# Patient Record
Sex: Female | Born: 1995 | Race: White | Hispanic: No | Marital: Married | Smoking: Never smoker
Health system: Southern US, Community
[De-identification: ages and names within clinical notes are randomized; demographics above are authoritative.]

## PROBLEM LIST (undated history)

## (undated) DIAGNOSIS — L709 Acne, unspecified: Secondary | ICD-10-CM

## (undated) HISTORY — DX: Acne, unspecified: L70.9

---
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
## 2015-01-23 ENCOUNTER — Telehealth: Payer: Self-pay | Admitting: Unknown Physician Specialty

## 2015-01-23 NOTE — Telephone Encounter (Signed)
Pt's father called, pt is away at college and needs a copy of her immunization records. Please call once report is ready for pick up.

## 2015-01-24 NOTE — Telephone Encounter (Signed)
Amy and I both looked in the system for the patient's shot records and could not find them. The patient's father came in and we told him that we could not find him ad asked for him to check with the health department or the patient's high school.

## 2017-01-25 ENCOUNTER — Ambulatory Visit (INDEPENDENT_AMBULATORY_CARE_PROVIDER_SITE_OTHER): Payer: BLUE CROSS/BLUE SHIELD | Admitting: Family Medicine

## 2017-01-25 ENCOUNTER — Encounter: Payer: Self-pay | Admitting: Family Medicine

## 2017-01-25 VITALS — BP 110/85 | HR 86 | Temp 99.1°F | Wt 139.0 lb

## 2017-01-25 DIAGNOSIS — Z30011 Encounter for initial prescription of contraceptive pills: Secondary | ICD-10-CM | POA: Diagnosis not present

## 2017-01-25 LAB — PREGNANCY, URINE: Preg Test, Ur: NEGATIVE

## 2017-01-25 MED ORDER — NORGESTIMATE-ETH ESTRADIOL 0.25-35 MG-MCG PO TABS: 1.0000 | ORAL_TABLET | Freq: Every day | ORAL | 11 refills | Status: DC

## 2017-01-25 NOTE — Patient Instructions (Addendum)
Follow up in about 3 months for fasting labs, complete physical exam, and pap smear Oral Contraception Information Oral contraceptive pills (OCPs) are medicines taken to prevent pregnancy. OCPs work by preventing the ovaries from releasing eggs. The hormones in OCPs also cause the cervical mucus to thicken, preventing the sperm from entering the uterus. The hormones also cause the uterine lining to become thin, not allowing a fertilized egg to attach to the inside of the uterus. OCPs are highly effective when taken exactly as prescribed. However, OCPs do not prevent sexually transmitted diseases (STDs). Safe sex practices, such as using condoms along with the pill, can help prevent STDs. Before taking the pill, you may have a physical exam and Pap test. Your health care provider may order blood tests. The health care provider will make sure you are a good candidate for oral contraception. Discuss with your health care provider the possible side effects of the OCP you may be prescribed. When starting an OCP, it can take 2 to 3 months for the body to adjust to the changes in hormone levels in your body. Types of oral contraception  The combination pill-This pill contains estrogen and progestin (synthetic progesterone) hormones. The combination pill comes in 21-day, 28-day, or 91-day packs. Some types of combination pills are meant to be taken continuously (365-day pills). With 21-day packs, you do not take pills for 7 days after the last pill. With 28-day packs, the pill is taken every day. The last 7 pills are without hormones. Certain types of pills have more than 21 hormone-containing pills. With 91-day packs, the first 84 pills contain both hormones, and the last 7 pills contain no hormones or contain estrogen only.  The minipill-This pill contains the progesterone hormone only. The pill is taken every day continuously. It is very important to take the pill at the same time each day. The minipill comes in  packs of 28 pills. All 28 pills contain the hormone. Advantages of oral contraceptive pills  Decreases premenstrual symptoms.  Treats menstrual period cramps.  Regulates the menstrual cycle.  Decreases a heavy menstrual flow.  May treatacne, depending on the type of pill.  Treats abnormal uterine bleeding.  Treats polycystic ovarian syndrome.  Treats endometriosis.  Can be used as emergency contraception. Things that can make oral contraceptive pills less effective OCPs can be less effective if:  You forget to take the pill at the same time every day.  You have a stomach or intestinal disease that lessens the absorption of the pill.  You take OCPs with other medicines that make OCPs less effective, such as antibiotics, certain HIV medicines, and some seizure medicines.  You take expired OCPs.  You forget to restart the pill on day 7, when using the packs of 21 pills.  Risks associated with oral contraceptive pills Oral contraceptive pills can sometimes cause side effects, such as:  Headache.  Nausea.  Breast tenderness.  Irregular bleeding or spotting.  Combination pills are also associated with a small increased risk of:  Blood clots.  Heart attack.  Stroke.  This information is not intended to replace advice given to you by your health care provider. Make sure you discuss any questions you have with your health care provider. Document Released: 08/07/2002 Document Revised: 10/23/2015 Document Reviewed: 11/05/2012 Elsevier Interactive Patient Education  [employer].

## 2017-01-25 NOTE — Progress Notes (Signed)
   BP 110/85   Pulse 86   Temp 99.1 F (37.3 C)   Wt 139 lb (63 kg)   LMP 01/22/2017 (Exact Date)   SpO2 100%    Subjective:    Patient ID: Kiara Franklin, female    DOB: 05/02/1996, 21 y.o.   MRN: [id number]  HPI: Kiara Franklin is a 21 y.o. female  Chief Complaint  Patient presents with  . Contraception    wants to get started on BCPs.   Patient presents today for initial prescription of contraceptives. Getting married soon and wanting to start something. Has never been on anything in the past. Periods have historically been mild and regular. No hx of migraines, blood clots, or fhx of breast cancer. Denies any chance of pregnancy.   Relevant past medical, surgical, family and social history reviewed and updated as indicated. Interim medical history since our last visit reviewed. Allergies and medications reviewed and updated.  Review of Systems  Constitutional: Negative.   HENT: Negative.   Respiratory: Negative.   Cardiovascular: Negative.   Gastrointestinal: Negative.   Genitourinary: Negative.   Musculoskeletal: Negative.   Neurological: Negative.   Psychiatric/Behavioral: Negative.    Per HPI unless specifically indicated above     Objective:    BP 110/85   Pulse 86   Temp 99.1 F (37.3 C)   Wt 139 lb (63 kg)   LMP 01/22/2017 (Exact Date)   SpO2 100%   Wt Readings from Last 3 Encounters:  01/25/17 139 lb (63 kg)    Physical Exam  Constitutional: She is oriented to person, place, and time. She appears well-developed and well-nourished. No distress.  HENT:  Head: Atraumatic.  Eyes: Pupils are equal, round, and reactive to light. Conjunctivae are normal.  Neck: Normal range of motion. Neck supple.  Cardiovascular: Normal rate and normal heart sounds.   Pulmonary/Chest: Effort normal and breath sounds normal. No respiratory distress.  Musculoskeletal: Normal range of motion.  Neurological: She is alert and oriented to person, place, and time.  Skin: Skin is  warm and dry.  Psychiatric: She has a normal mood and affect. Her behavior is normal.  Nursing note and vitals reviewed.  No results found for this or any previous visit.    Assessment & Plan:   Problem List Items Addressed This Visit    None    Visit Diagnoses    Encounter for oral contraception initial prescription    -  Primary   Await urine pregnancy results. Ortho cyclen sent, after long discussion of many options of birth controls. Risks and benefits reviewed. F/u for CPE and pap   Relevant Orders   Pregnancy, urine       Follow up plan: Return in about 3 months (around 04/27/2017) for CPE and pap.

## 2017-02-15 ENCOUNTER — Encounter: Payer: Self-pay | Admitting: Family Medicine

## 2017-03-17 ENCOUNTER — Other Ambulatory Visit: Payer: Self-pay | Admitting: Family Medicine

## 2017-03-17 ENCOUNTER — Encounter: Payer: Self-pay | Admitting: Family Medicine

## 2017-03-17 MED ORDER — NORGESTIMATE-ETH ESTRADIOL 0.25-35 MG-MCG PO TABS: 1.0000 | ORAL_TABLET | Freq: Every day | ORAL | 2 refills | Status: DC

## 2017-03-18 ENCOUNTER — Encounter: Payer: Self-pay | Admitting: Family Medicine

## 2017-03-18 NOTE — Telephone Encounter (Signed)
I spoke with patient, she is getting married next Sat. She is scheduled to start her period right before her wedding. Wants to know if she can go ahead and start her next pack of BCP to hopefully prevent her cycle?

## 2017-04-08 ENCOUNTER — Other Ambulatory Visit: Payer: Self-pay

## 2017-04-08 MED ORDER — NORGESTIMATE-ETH ESTRADIOL 0.25-35 MG-MCG PO TABS: 1.0000 | ORAL_TABLET | Freq: Every day | ORAL | 3 refills | Status: DC

## 2017-04-08 NOTE — Telephone Encounter (Signed)
Request for 90 day supply of Sprintec.

## 2017-04-08 NOTE — Telephone Encounter (Signed)
Rx changed to 90 days and sent

## 2017-09-14 ENCOUNTER — Encounter: Payer: Self-pay | Admitting: Unknown Physician Specialty

## 2017-09-14 ENCOUNTER — Ambulatory Visit: Payer: BLUE CROSS/BLUE SHIELD | Admitting: Unknown Physician Specialty

## 2017-09-14 VITALS — BP 133/90 | HR 90 | Temp 99.8°F | Wt 141.7 lb

## 2017-09-14 DIAGNOSIS — J029 Acute pharyngitis, unspecified: Secondary | ICD-10-CM | POA: Diagnosis not present

## 2017-09-14 DIAGNOSIS — J039 Acute tonsillitis, unspecified: Secondary | ICD-10-CM | POA: Diagnosis not present

## 2017-09-14 MED ORDER — AMOXICILLIN 875 MG PO TABS: 875.0000 mg | ORAL_TABLET | Freq: Two times a day (BID) | ORAL | 0 refills | Status: DC

## 2017-09-14 NOTE — Progress Notes (Signed)
BP 133/90   Pulse 90   Temp 99.8 F (37.7 C) (Oral)   Wt 141 lb 11.2 oz (64.3 kg)   SpO2 99%    Subjective:    Patient ID: Kiara Franklin, female    DOB: 26-Apr-1996, 22 y.o.   MRN: [id number]  HPI: Kiara Franklin is a 22 y.o. female  Chief Complaint  Patient presents with  . Sore Throat    pt states she woke up with a sore throat this morning   Sore Throat   This is a new problem. The current episode started today. The problem has been unchanged. The pain is worse on the left side. There has been no fever. Pertinent negatives include no abdominal pain, congestion, coughing, diarrhea, drooling, ear discharge, ear pain, headaches, hoarse voice, plugged ear sensation, neck pain, shortness of breath, stridor, swollen glands, trouble swallowing or vomiting. She has had no exposure to strep or mono.    Relevant past medical, surgical, family and social history reviewed and updated as indicated. Interim medical history since our last visit reviewed. Allergies and medications reviewed and updated.  Review of Systems  HENT: Negative for congestion, drooling, ear discharge, ear pain, hoarse voice and trouble swallowing.   Respiratory: Negative for cough, shortness of breath and stridor.   Gastrointestinal: Negative for abdominal pain, diarrhea and vomiting.  Musculoskeletal: Negative for neck pain.  Neurological: Negative for headaches.    Per HPI unless specifically indicated above     Objective:    BP 133/90   Pulse 90   Temp 99.8 F (37.7 C) (Oral)   Wt 141 lb 11.2 oz (64.3 kg)   SpO2 99%   Wt Readings from Last 3 Encounters:  09/14/17 141 lb 11.2 oz (64.3 kg)  01/25/17 139 lb (63 kg)    Physical Exam  Constitutional: She is oriented to person, place, and time. She appears well-developed and well-nourished. No distress.  HENT:  Head: Normocephalic and atraumatic.  Right Ear: Tympanic membrane and ear canal normal.  Left Ear: Tympanic membrane and ear canal normal.  Nose:  Rhinorrhea present. Right sinus exhibits no maxillary sinus tenderness and no frontal sinus tenderness. Left sinus exhibits no maxillary sinus tenderness and no frontal sinus tenderness.  Mouth/Throat: Mucous membranes are normal. Posterior oropharyngeal erythema present. Tonsillar exudate.  Eyes: Conjunctivae and lids are normal. Right eye exhibits no discharge. Left eye exhibits no discharge. No scleral icterus.  Cardiovascular: Normal rate and regular rhythm.  Pulmonary/Chest: Effort normal and breath sounds normal. No respiratory distress.  Abdominal: Normal appearance. There is no splenomegaly or hepatomegaly.  Musculoskeletal: Normal range of motion.  Neurological: She is alert and oriented to person, place, and time.  Skin: Skin is intact. No rash noted. No pallor.  Psychiatric: She has a normal mood and affect. Her behavior is normal. Judgment and thought content normal.    Results for orders placed or performed in visit on 01/25/17  Pregnancy, urine  Result Value Ref Range   Preg Test, Ur Negative Negative      Assessment & Plan:   Problem List Items Addressed This Visit    None    Visit Diagnoses    Sore throat    -  Primary   Salt water gargles.  Tylenol and/or Ibuprofen.  Good handwashing to prevent transmission   Relevant Orders   Rapid St[facility] Screen ([facility])   Tonsillitis       with exudate.  Will rx with Amoxil 875 mg BID  for 10 days       Follow up plan: Return if symptoms worsen or fail to improve.

## 2017-09-17 LAB — RAPID STREP SCREEN (MED CTR MEBANE ONLY): Strep Gp A Ag, IA W/Reflex: NEGATIVE

## 2017-09-17 LAB — CULTURE, GROUP A STREP: STREP A CULTURE: NEGATIVE

## 2018-03-08 ENCOUNTER — Other Ambulatory Visit: Payer: Self-pay | Admitting: Family Medicine

## 2018-03-08 NOTE — Telephone Encounter (Addendum)
Contacted pt regarding scheduling appointment; last office visit 01/25/17; no upcoming visits noted; she states that she will check her work schedule and call back to schedule an office visit.  Requested Prescriptions  Pending Prescriptions Disp Refills  . SPRINTEC 28 0.25-35 MG-MCG tablet [Pharmacy Med Name: SPRINTEC 28 DAY TABLET] 84 tablet 3    Sig: TAKE 1 TABLET BY MOUTH EVERY DAY     OB/GYN:  Contraceptives Failed - 03/08/2018  1:47 AM      Failed - Last BP in normal range    BP Readings from Last 1 Encounters:  09/14/17 133/90         Passed - Valid encounter within last 12 months    Recent Outpatient Visits          5 months ago Sore throat   [facility] Gabriel Cirri, NP   1 year ago Encounter for oral contraception initial prescription   [facility] Roosvelt Maser [place], [place]

## 2018-05-30 ENCOUNTER — Encounter: Payer: Self-pay | Admitting: Family Medicine

## 2018-05-30 ENCOUNTER — Telehealth: Payer: Self-pay | Admitting: Family Medicine

## 2018-05-30 ENCOUNTER — Ambulatory Visit: Payer: BLUE CROSS/BLUE SHIELD | Admitting: Family Medicine

## 2018-05-30 VITALS — BP 160/93 | HR 110 | Temp 98.9°F | Ht 64.0 in | Wt 160.0 lb

## 2018-05-30 DIAGNOSIS — R300 Dysuria: Secondary | ICD-10-CM | POA: Diagnosis not present

## 2018-05-30 DIAGNOSIS — N390 Urinary tract infection, site not specified: Secondary | ICD-10-CM

## 2018-05-30 MED ORDER — SULFAMETHOXAZOLE-TRIMETHOPRIM 800-160 MG PO TABS: 1.0000 | ORAL_TABLET | Freq: Two times a day (BID) | ORAL | 0 refills | Status: DC

## 2018-05-30 NOTE — Telephone Encounter (Signed)
Bactrim sent to [facility]

## 2018-05-30 NOTE — Progress Notes (Signed)
BP (!) 160/93 (BP Location: Right Arm, Patient Position: Sitting, Cuff Size: Normal)   Pulse (!) 110   Temp 98.9 F (37.2 C) (Oral)   Ht 5\' 4"  (1.626 m)   Wt 160 lb (72.6 kg)   SpO2 93%   BMI 27.46 kg/m    Subjective:    Patient ID: Kiara Franklin, female    DOB: 03-08-1996, 22 y.o.   MRN: [id number]  HPI: Kiara Franklin is a 22 y.o. female  Chief Complaint  Patient presents with  . Dysuria    Ongoing 1 day  . Urine Odor   Urine odor and dysuria x 1 day. Took some azo twice so far with some relief. Denies fevers, chills, sweats, abdominal pain, back pain, discharge, itching or irritation. No concern for STIs, pregnancy.   Relevant past medical, surgical, family and social history reviewed and updated as indicated. Interim medical history since our last visit reviewed. Allergies and medications reviewed and updated.  Review of Systems  Per HPI unless specifically indicated above     Objective:    BP (!) 160/93 (BP Location: Right Arm, Patient Position: Sitting, Cuff Size: Normal)   Pulse (!) 110   Temp 98.9 F (37.2 C) (Oral)   Ht 5\' 4"  (1.626 m)   Wt 160 lb (72.6 kg)   SpO2 93%   BMI 27.46 kg/m   Wt Readings from Last 3 Encounters:  05/30/18 160 lb (72.6 kg)  09/14/17 141 lb 11.2 oz (64.3 kg)  01/25/17 139 lb (63 kg)    Physical Exam Vitals signs and nursing note reviewed.  Constitutional:      Appearance: Normal appearance. She is not ill-appearing.  HENT:     Head: Atraumatic.  Eyes:     Extraocular Movements: Extraocular movements intact.     Conjunctiva/sclera: Conjunctivae normal.  Neck:     Musculoskeletal: Normal range of motion and neck supple.  Cardiovascular:     Rate and Rhythm: Normal rate and regular rhythm.     Heart sounds: Normal heart sounds.  Pulmonary:     Effort: Pulmonary effort is normal.     Breath sounds: Normal breath sounds.  Abdominal:     General: Bowel sounds are normal. There is no distension.     Palpations: Abdomen is  soft.     Tenderness: There is no abdominal tenderness. There is no right CVA tenderness or left CVA tenderness.  Musculoskeletal: Normal range of motion.  Skin:    General: Skin is warm and dry.  Neurological:     Mental Status: She is alert and oriented to person, place, and time.  Psychiatric:        Mood and Affect: Mood normal.        Thought Content: Thought content normal.        Judgment: Judgment normal.     Results for orders placed or performed in visit on 05/30/18  Microscopic Examination  Result Value Ref Range   WBC, UA 11-30 (A) 0 - 5 /hpf   RBC, UA None seen 0 - 2 /hpf   Epithelial Cells (non renal) 0-10 0 - 10 /hpf   Bacteria, UA Many (A) None seen/Few  UA/M w/rflx Culture, Routine  Result Value Ref Range   Specific Gravity, UA 1.010 1.005 - 1.030   pH, UA 5.0 5.0 - 7.5   Color, UA Orange Yellow   Appearance Ur Clear Clear   Leukocytes, UA 3+ (A) Negative   Protein, UA 2+ (A) Negative/Trace  Glucose, UA 1+ (A) Negative   Ketones, UA 1+ (A) Negative   RBC, UA 3+ (A) Negative   Bilirubin, UA 2+ (A) Negative   Urobilinogen, Ur 4.0 (H) 0.2 - 1.0 mg/dL   Nitrite, UA Positive (A) Negative   Microscopic Examination See below:    Urinalysis Reflex WILL FOLLOW       Assessment & Plan:   Problem List Items Addressed This Visit    None    Visit Diagnoses    Acute lower UTI    -  Primary   Tx with bactrim, probiotics. Await cx. Push fluids. F/u if worsening or not improving   Relevant Medications   sulfamethoxazole-trimethoprim (BACTRIM DS,SEPTRA DS) 800-160 MG tablet   Other Relevant Orders   UA/M w/rflx Culture, Routine (Completed)       Follow up plan: Return if symptoms worsen or fail to improve.

## 2018-05-30 NOTE — Telephone Encounter (Signed)
Patient called to see when/if her [facility] was sent [facility]  Please advise.  Thank you

## 2018-05-30 NOTE — Telephone Encounter (Signed)
Spoke with provider. Will see at 10 a.m.

## 2018-06-02 ENCOUNTER — Other Ambulatory Visit: Payer: Self-pay | Admitting: Family Medicine

## 2018-06-02 ENCOUNTER — Encounter: Payer: Self-pay | Admitting: Family Medicine

## 2018-06-02 MED ORDER — FLUCONAZOLE 150 MG PO TABS: 150.0000 mg | ORAL_TABLET | Freq: Once | ORAL | 0 refills | Status: AC

## 2018-06-15 LAB — UA/M W/RFLX CULTURE, ROUTINE
NITRITE UA: POSITIVE — AB
PH UA: 5 (ref 5.0–7.5)
Specific Gravity, UA: 1.01 (ref 1.005–1.030)
UUROB: 4 mg/dL — AB (ref 0.2–1.0)

## 2018-06-15 LAB — MICROSCOPIC EXAMINATION: RBC MICROSCOPIC, UA: NONE SEEN /HPF (ref 0–2)

## 2018-08-01 ENCOUNTER — Encounter: Payer: Self-pay | Admitting: Family Medicine

## 2018-08-25 ENCOUNTER — Other Ambulatory Visit: Payer: Self-pay | Admitting: Family Medicine

## 2018-09-12 ENCOUNTER — Encounter: Payer: Self-pay | Admitting: Family Medicine

## 2018-09-12 NOTE — Telephone Encounter (Signed)
Please see if she would like to schedule a virtual visit

## 2018-09-14 ENCOUNTER — Encounter: Payer: Self-pay | Admitting: Family Medicine

## 2018-09-14 ENCOUNTER — Ambulatory Visit (INDEPENDENT_AMBULATORY_CARE_PROVIDER_SITE_OTHER): Payer: BLUE CROSS/BLUE SHIELD | Admitting: Family Medicine

## 2018-09-14 ENCOUNTER — Other Ambulatory Visit: Payer: Self-pay

## 2018-09-14 VITALS — Ht 64.0 in | Wt 148.5 lb

## 2018-09-14 DIAGNOSIS — K0889 Other specified disorders of teeth and supporting structures: Secondary | ICD-10-CM

## 2018-09-14 MED ORDER — CLINDAMYCIN HCL 150 MG PO CAPS: 150.0000 mg | ORAL_CAPSULE | Freq: Two times a day (BID) | ORAL | 0 refills | Status: DC

## 2018-09-14 NOTE — Progress Notes (Signed)
Ht 5\' 4"  (1.626 m)   Wt 148 lb 8 oz (67.4 kg)   BMI 25.49 kg/m    Subjective:    Patient ID: Kiara Franklin, female    DOB: [birth year], 23 y.o.   MRN: [id number]  HPI: Kiara Franklin is a 23 y.o. female  Chief Complaint  Patient presents with  . Dental Pain    bottom right side. since last Saturday. tried OTC med . not helping    . This visit was completed via WebEx due to the restrictions of the COVID-19 pandemic. All issues as above were discussed and addressed. Physical exam was done as above through visual confirmation on WebEx. If it was felt that the patient should be evaluated in the office, they were directed there. The patient verbally consented to this visit. . Location of the patient: home . Location of the provider: work . Those involved with this call:  . Provider: Roosvelt Maserachel Lane, PA-C . CMA: Elton SinAnita Quito, CMA . Front Desk/Registration: Harriet PhoJoliza Johnson  . Time spent on call: 15 minutes with patient face to face via video conference. More than 50% of this time was spent in counseling and coordination of care. 5 minutes total spent in review of patient's record and preparation of their chart.  Pt with right lower gum pain, redness, and swelling x 5 days, 7/10 pain at times but typically about 4/10. Thinks one of her wisdom teeth is popping through the gums, no known broken teeth or injuries. Has been doing salt water gargles and OTC pain relievers with mild relief. No fevers, chills, severe headaches, purulent drainage, inability to tolerate PO. Unable to get in with dental clinic due to COVID 19 restrictions.   Relevant past medical, surgical, family and social history reviewed and updated as indicated. Interim medical history since our last visit reviewed. Allergies and medications reviewed and updated.  Review of Systems  Per HPI unless specifically indicated above     Objective:    Ht 5\' 4"  (1.626 m)   Wt 148 lb 8 oz (67.4 kg)   BMI 25.49 kg/m   Wt Readings from Last  3 Encounters:  09/14/18 148 lb 8 oz (67.4 kg)  05/30/18 160 lb (72.6 kg)  09/14/17 141 lb 11.2 oz (64.3 kg)    Physical Exam Vitals signs and nursing note reviewed.  Constitutional:      General: She is not in acute distress.    Appearance: Normal appearance.  HENT:     Head: Atraumatic.     Right Ear: External ear normal.     Left Ear: External ear normal.     Nose: Nose normal. No congestion.     Mouth/Throat:     Mouth: Mucous membranes are moist.     Pharynx: Oropharynx is clear. No posterior oropharyngeal erythema.     Comments: Right posterior lower gumline erythematous and edematous, no active drainage Eyes:     Extraocular Movements: Extraocular movements intact.     Conjunctiva/sclera: Conjunctivae normal.  Neck:     Musculoskeletal: Normal range of motion.  Cardiovascular:     Comments: Unable to assess via virtual visit Pulmonary:     Effort: Pulmonary effort is normal. No respiratory distress.  Musculoskeletal: Normal range of motion.  Skin:    General: Skin is dry.     Findings: No erythema.  Neurological:     Mental Status: She is alert and oriented to person, place, and time.  Psychiatric:        Mood  and Affect: Mood normal.        Thought Content: Thought content normal.        Judgment: Judgment normal.     Results for orders placed or performed in visit on 05/30/18  Microscopic Examination  Result Value Ref Range   WBC, UA 11-30 (A) 0 - 5 /hpf   RBC, UA None seen 0 - 2 /hpf   Epithelial Cells (non renal) 0-10 0 - 10 /hpf   Bacteria, UA Many (A) None seen/Few  UA/M w/rflx Culture, Routine  Result Value Ref Range   Specific Gravity, UA 1.010 1.005 - 1.030   pH, UA 5.0 5.0 - 7.5   Color, UA Orange Yellow   Appearance Ur Clear Clear   Leukocytes, UA 3+ (A) Negative   Protein, UA 2+ (A) Negative/Trace   Glucose, UA 1+ (A) Negative   Ketones, UA 1+ (A) Negative   RBC, UA 3+ (A) Negative   Bilirubin, UA 2+ (A) Negative   Urobilinogen, Ur 4.0 (H)  0.2 - 1.0 mg/dL   Nitrite, UA Positive (A) Negative   Microscopic Examination See below:       Assessment & Plan:   Problem List Items Addressed This Visit    None    Visit Diagnoses    Pain, dental    -  Primary   Suspect superficial gum infection, clindamycin sent, cont salt water gargles, good oral hygiene. F/u with dentist once able to get in touch with them       Follow up plan: Return if symptoms worsen or fail to improve.

## 2019-01-22 DIAGNOSIS — Z20828 Contact with and (suspected) exposure to other viral communicable diseases: Secondary | ICD-10-CM | POA: Diagnosis not present

## 2019-01-25 ENCOUNTER — Encounter: Payer: Self-pay | Admitting: Family Medicine

## 2019-01-30 ENCOUNTER — Ambulatory Visit (INDEPENDENT_AMBULATORY_CARE_PROVIDER_SITE_OTHER): Payer: BC Managed Care – PPO | Admitting: Family Medicine

## 2019-01-30 ENCOUNTER — Other Ambulatory Visit: Payer: Self-pay

## 2019-01-30 ENCOUNTER — Encounter: Payer: Self-pay | Admitting: Family Medicine

## 2019-01-30 VITALS — Ht 64.0 in | Wt 145.0 lb

## 2019-01-30 DIAGNOSIS — F419 Anxiety disorder, unspecified: Secondary | ICD-10-CM | POA: Diagnosis not present

## 2019-01-30 DIAGNOSIS — Z3009 Encounter for other general counseling and advice on contraception: Secondary | ICD-10-CM

## 2019-01-30 NOTE — Progress Notes (Signed)
Ht 5\' 4"  (1.626 m)    Wt 145 lb (65.8 kg)    BMI 24.89 kg/m    Subjective:    Patient ID: Kiara Franklin, female    DOB: 06/12/1995, 23 y.o.   MRN: [id number]  HPI: Kiara Franklin is a 23 y.o. female  Chief Complaint  Patient presents with   Anxiety    Patient states she's always had it but as time has gone by she can tell differences. Example: Heart Racing. Patient states it isn't anything extreme.   Referral    [facility]     This visit was completed via WebEx due to the restrictions of the COVID-19 pandemic. All issues as above were discussed and addressed. Physical exam was done as above through visual confirmation on WebEx. If it was felt that the patient should be evaluated in the office, they were directed there. The patient verbally consented to this visit.  Location of the patient: in parked car  Location of the provider: home  Those involved with this call:   Provider: Roosvelt Maserachel Oseph Imburgia, PA-C  CMA: Myrtha MantisKeri Bullock, CMA  Front Desk/Registration: Harriet PhoJoliza Johnson   Time spent on call: 15 minutes with patient face to face via video conference. More than 50% of this time was spent in counseling and coordination of care. 5 minutes total spent in review of patient's record and preparation of their chart. I verified patient identity using two factors (patient name and date of birth). Patient consents verbally to being seen via telemedicine visit today.   Presenting today for a preliminary discussion about anxiety. 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 She states nothing severe or debilitating but is noticing she may benefit from being on something to help her. Does have a fhx of anxiety and depression. No SI/HI, crying spells, mood concerns, significant panic episodes. Has never been on anything for this in the past. Notes she plans to start trying to get pregnant soon (stopped  her birth control last week) so would not be interested in anything that is not pregnancy safe.   [facility] 01/30/2019 01/25/2017  Decreased Interest 0 0  Down, Depressed, Hopeless 0 0  PHQ - 2 Score 0 0  Altered sleeping 0 -  Tired, decreased energy 0 -  Change in appetite 0 -  Feeling bad or failure about yourself  0 -  Trouble concentrating 0 -  Moving slowly or fidgety/restless 0 -  Suicidal thoughts 0 -  PHQ-9 Score 0 -   GAD 7 : Generalized Anxiety Score 01/30/2019  Nervous, Anxious, on Edge 1  Control/stop worrying 1  Worry too much - different things 1  Trouble relaxing 0  Restless 0  Easily annoyed or irritable 0  Afraid - awful might happen 0  Total GAD 7 Score 3  Anxiety Difficulty Not difficult at all   Relevant past medical, surgical, family and social history reviewed and updated as indicated. Interim medical history since our last visit reviewed. Allergies and medications reviewed and updated.  Review of Systems  Per HPI unless specifically indicated above     Objective:    Ht 5\' 4"  (1.626 m)    Wt 145 lb (65.8 kg)    BMI 24.89 kg/m   Wt Readings from Last 3 Encounters:  01/30/19 145 lb (65.8 kg)  09/14/18 148 lb 8 oz (67.4 kg)  05/30/18 160 lb (  72.6 kg)    Physical Exam Vitals signs and nursing note reviewed.  Constitutional:      General: She is not in acute distress.    Appearance: Normal appearance.  HENT:     Head: Atraumatic.     Right Ear: External ear normal.     Left Ear: External ear normal.     Nose: Nose normal. No congestion.     Mouth/Throat:     Mouth: Mucous membranes are moist.     Pharynx: Oropharynx is clear. No posterior oropharyngeal erythema.  Eyes:     Extraocular Movements: Extraocular movements intact.     Conjunctiva/sclera: Conjunctivae normal.  Neck:     Musculoskeletal: Normal range of motion.  Cardiovascular:     Comments: Unable to assess via virtual visit Pulmonary:     Effort: Pulmonary effort  is normal. No respiratory distress.  Musculoskeletal: Normal range of motion.  Skin:    General: Skin is dry.     Findings: No erythema.  Neurological:     Mental Status: She is alert and oriented to person, place, and time.  Psychiatric:        Mood and Affect: Mood normal.        Thought Content: Thought content normal.        Judgment: Judgment normal.     Results for orders placed or performed in visit on 05/30/18  Microscopic Examination   URINE  Result Value Ref Range   WBC, UA 11-30 (A) 0 - 5 /hpf   RBC, UA None seen 0 - 2 /hpf   Epithelial Cells (non renal) 0-10 0 - 10 /hpf   Bacteria, UA Many (A) None seen/Few  UA/M w/rflx Culture, Routine   Specimen: Urine   URINE  Result Value Ref Range   Specific Gravity, UA 1.010 1.005 - 1.030   pH, UA 5.0 5.0 - 7.5   Color, UA Orange Yellow   Appearance Ur Clear Clear   Leukocytes, UA 3+ (A) Negative   Protein, UA 2+ (A) Negative/Trace   Glucose, UA 1+ (A) Negative   Ketones, UA 1+ (A) Negative   RBC, UA 3+ (A) Negative   Bilirubin, UA 2+ (A) Negative   Urobilinogen, Ur 4.0 (H) 0.2 - 1.0 mg/dL   Nitrite, UA Positive (A) Negative   Microscopic Examination See below:       Assessment & Plan:   Problem List Items Addressed This Visit    None    Visit Diagnoses    Anxiety    -  Primary   Discussed breathing exercises, meditation, calming essential oils, counseling, prn and daily meds. Pt will think about it and let us know what she decides   Encounter for other general counseling or advice on contraception       Patient coming off her oral contraceptives, desiring pregnancy. Requesting referral to [facility] for her women's health care needs   Relevant Orders   Ambulatory referral to Gynecology       Follow up plan: Return if symptoms worsen or fail to improve.

## 2019-02-02 ENCOUNTER — Encounter: Payer: Self-pay | Admitting: Family Medicine

## 2019-02-04 ENCOUNTER — Other Ambulatory Visit: Payer: Self-pay | Admitting: Family Medicine

## 2019-02-16 ENCOUNTER — Other Ambulatory Visit (HOSPITAL_COMMUNITY)
Admission: RE | Admit: 2019-02-16 | Discharge: 2019-02-16 | Disposition: A | Discharge status: Home / Self Care | Payer: BC Managed Care – PPO | Source: Ambulatory Visit | Attending: Obstetrics and Gynecology | Admitting: Obstetrics and Gynecology

## 2019-02-16 ENCOUNTER — Telehealth: Payer: Self-pay | Admitting: Family Medicine

## 2019-02-16 ENCOUNTER — Encounter: Payer: Self-pay | Admitting: Obstetrics and Gynecology

## 2019-02-16 ENCOUNTER — Ambulatory Visit (INDEPENDENT_AMBULATORY_CARE_PROVIDER_SITE_OTHER): Payer: BC Managed Care – PPO | Admitting: Obstetrics and Gynecology

## 2019-02-16 ENCOUNTER — Other Ambulatory Visit: Payer: Self-pay

## 2019-02-16 VITALS — BP 160/90 | HR 96 | Ht 64.0 in | Wt 150.0 lb

## 2019-02-16 DIAGNOSIS — Z01419 Encounter for gynecological examination (general) (routine) without abnormal findings: Secondary | ICD-10-CM

## 2019-02-16 DIAGNOSIS — Z124 Encounter for screening for malignant neoplasm of cervix: Secondary | ICD-10-CM | POA: Diagnosis not present

## 2019-02-16 DIAGNOSIS — Z0000 Encounter for general adult medical examination without abnormal findings: Secondary | ICD-10-CM

## 2019-02-16 NOTE — Patient Instructions (Signed)
Follow up with PCP in 2-3 weeks after creating a daily Blood Pressure log  Start daily PNV

## 2019-02-16 NOTE — Progress Notes (Signed)
Patient ID: Kiara Franklin, female   DOB: May 26, 1996, 23 y.o.   MRN: [id number]  Reason for Consult: Referral (Talk about pregnancy )   Referred by Particia NearingLane, Rachel Elizabeth,*  Subjective:     HPI:  Kiara Franklin is a 23 y.o. female . She presents today to initate GYN care and to discuss preconception counseling  Discussed hx of elevated BP/ She reports anxiety and white coat hypertension. She takes BP at home and it is normal.   Gynecological History Menarche: 12-13 Menopause: not applicable LMP: Beginning of Spetember Describes periods as regular and monthly Last pap smear: never Last Mammogram: never History of STDs: denies Sexually Active: yes  Obstetrical History G0P0  Past Medical History:  Diagnosis Date  . Acne    Family History  Problem Relation Age of Onset  . Cancer Neg Hx   . COPD Neg Hx   . Diabetes Neg Hx   . Heart disease Neg Hx   . Stroke Neg Hx    Past Surgical History:  Procedure Laterality Date  . KNEE SURGERY Left 11/2012    Short Social History:  Social History   Tobacco Use  . Smoking status: Never Smoker  . Smokeless tobacco: Never Used  Substance Use Topics  . Alcohol use: No    No Known Allergies  No current outpatient medications on file.   No current facility-administered medications for this visit.     REVIEW OF SYSTEMS      Objective:  Objective   Vitals:   02/16/19 0812  BP: (!) 160/90  Pulse: 96  Weight: 150 lb (68 kg)  Height: 5\' 4"  (1.626 m)   Body mass index is 25.75 kg/m.  Physical Exam Vitals signs and nursing note reviewed.  Constitutional:      Appearance: She is well-developed.  HENT:     Head: Normocephalic and atraumatic.  Eyes:     Pupils: Pupils are equal, round, and reactive to light.  Cardiovascular:     Rate and Rhythm: Normal rate and regular rhythm.  Pulmonary:     Effort: Pulmonary effort is normal. No respiratory distress.  Chest:     Comments: Declined breast examination  Genitourinary:    Comments: External: Normal appearing vulva. No lesions noted.  Speculum examination: Normal appearing cervix. No blood in the vaginal vault. no discharge.   Bimanual examination: Uterus midline, non-tender, normal in size, shape and contour.  No CMT. No adnexal masses. No adnexal tenderness. Pelvis not fixed.    Skin:    General: Skin is warm and dry.  Neurological:     Mental Status: She is alert and oriented to person, place, and time.  Psychiatric:        Behavior: Behavior normal.        Thought Content: Thought content normal.        Judgment: Judgment normal.        Assessment/Plan:     23 yo  Reviewed preconception counseling Encouraged to track first day of LMP Encouraged to take BP daily at home and create a log. Follow up with PCP in 2-3 week to review log. Bring home monitor to visit to compare values. May need BP medication initiation.  Pap smear today. GC&CT testing today/ Encouraged to initiate PNV Discussed timing of intercourse for conception and to follow up if she is unable to achieve pregnancy after 1 year.  Given samples of PNV.   More than 30 minutes were spent face to face with the patient  in the room with more than 50% of the time spent providing counseling and discussing the plan of management.    Adrian Prows MD [facility] 02/16/2019 9:08 AM

## 2019-02-16 NOTE — Telephone Encounter (Signed)
Called pt no answer left detailed message

## 2019-02-16 NOTE — Telephone Encounter (Signed)
Please let her know that she can look around and see where she would like to go and directly call to schedule. They do not require referrals for GYN. If she knows where she wants to go and would prefer a referral I can place one but she does not need a referral.

## 2019-02-16 NOTE — Telephone Encounter (Signed)
Please let her know that she can look around and see where she would like to go and directly call to schedule. They do not require referrals for GYN. If she knows where she wants to go and would prefer a referral I can place one but she does not need a referral.   Copied from [place] [phone]. Topic: General - Other >> Feb 16, 2019  9:28 AM Leward Quan A wrote: Reason for CRM: Patient called to ask Merrie Roof to please refer her to a different GYN because she had her first visit this morning and its not going to work. Can be reached at or on My Chart Ph#  [phone]

## 2019-02-19 ENCOUNTER — Other Ambulatory Visit: Payer: Self-pay

## 2019-02-19 ENCOUNTER — Ambulatory Visit
Admission: EM | Admit: 2019-02-19 | Discharge: 2019-02-19 | Disposition: A | Discharge status: Home / Self Care | Payer: BC Managed Care – PPO | Attending: Family Medicine | Admitting: Family Medicine

## 2019-02-19 DIAGNOSIS — R42 Dizziness and giddiness: Secondary | ICD-10-CM

## 2019-02-19 DIAGNOSIS — Z3202 Encounter for pregnancy test, result negative: Secondary | ICD-10-CM

## 2019-02-19 LAB — PREGNANCY, URINE: Preg Test, Ur: NEGATIVE

## 2019-02-19 MED ORDER — MECLIZINE HCL 25 MG PO TABS: 25.0000 mg | ORAL_TABLET | Freq: Three times a day (TID) | ORAL | 0 refills | Status: DC | PRN

## 2019-02-19 MED ORDER — MECLIZINE HCL 25 MG PO TABS
25.0000 mg | ORAL_TABLET | Freq: Once | ORAL | Status: AC
  Administered 2019-02-19: 10:00:00 25 mg via ORAL

## 2019-02-19 NOTE — ED Notes (Signed)
Pt states that her BP is "always high when I go to doctor". Has self monitored BP at home and reports that it is normal.

## 2019-02-19 NOTE — ED Provider Notes (Signed)
[facility] ____________________________________________  Time seen: Approximately 9:38 AM  I have reviewed the triage vital signs and the nursing notes.   HISTORY  Chief Complaint Dizziness  HPI Kiara Franklin is a 23 y.o. female presenting for evaluation of dizziness.  Patient states felt fine last night, woke up this morning still felt fine but when she sat up out of bed she had onset of room spinning sensation.  States when she lies flat or sits completely still she feels fine.  Reports with any position changes she has brief episodes lasting less than 30 seconds of dizziness with room spinning.  States head feels somewhat heavy and fuzzy during these episodes, but not otherwise.  Denies any headache, vision changes, paresthesias, unilateral weakness, fall or head injury.  No recent fevers, cough, neck pain, ear pain or sore throat.  Denies known sick contacts.  Denies history of similar.  Denies alleviating measures.  Reports otherwise doing well.  Patient's last menstrual period was 02/02/2019 (exact date).  Volney American, PA-C: PCP   Past Medical History:  Diagnosis Date   Acne     There are no active problems to display for this patient.   Past Surgical History:  Procedure Laterality Date   KNEE SURGERY Left 11/2012     No current facility-administered medications for this encounter.   Current Outpatient Medications:    meclizine (ANTIVERT) 25 MG tablet, Take 1 tablet (25 mg total) by mouth 3 (three) times daily as needed for dizziness., Disp: 30 tablet, Rfl: 0  Allergies Patient has no known allergies.  Family History  Problem Relation Age of Onset   Cancer Neg Hx    COPD Neg Hx    Diabetes Neg Hx    Heart disease Neg Hx    Stroke Neg Hx     Social History Social History   Tobacco Use   Smoking status: Never Smoker   Smokeless tobacco: Never Used  Substance Use Topics   Alcohol use: No   Drug use: No    Review of  Systems Constitutional: No fever.  Eyes: No visual changes. ENT: No sore throat. Cardiovascular: Denies chest pain. Respiratory: Denies shortness of breath. Gastrointestinal: No abdominal pain.  No nausea, no vomiting.  No diarrhea.   Genitourinary: Negative for dysuria. Musculoskeletal: Negative for back pain. Skin: Negative for rash. Neurological: Positive dizziness. Negative for headaches, focal weakness or numbness.    ____________________________________________   PHYSICAL EXAM:  VITAL SIGNS: ED Triage Vitals  Enc Vitals Group     BP 02/19/19 0911 (!) 159/102     Pulse Rate 02/19/19 0911 85     Resp 02/19/19 0911 18     Temp 02/19/19 0911 98.1 F (36.7 C)     Temp Source 02/19/19 0911 Oral     SpO2 02/19/19 0911 100 %     Weight 02/19/19 0912 147 lb (66.7 kg)     Height 02/19/19 0912 5\' 4"  (1.626 m)     Head Circumference --      Peak Flow --      Pain Score 02/19/19 0912 0     Pain Loc --      Pain Edu? --      Excl. in [place]? --    Vitals:   02/19/19 0911 02/19/19 0912  02/19/19 0948  BP: (!) 159/102   (!) 134/104  Pulse: 85     Resp: 18     Temp: 98.1 F (36.7 C)     TempSrc:  Oral     SpO2: 100%     Weight:  147 lb (66.7 kg)    Height:  5\' 4"  (1.626 m)       Constitutional: Alert and oriented. Well appearing and in no acute distress. Eyes: Conjunctivae are normal. PERRL. EOMI. no pain with EOMs. ENT      Head: Normocephalic and atraumatic.      Nose: No congestion Cardiovascular: Normal rate, regular rhythm. Grossly normal heart sounds.  Good peripheral circulation. Respiratory: Normal respiratory effort without tachypnea nor retractions. Breath sounds are clear and equal bilaterally. No wheezes, rales, rhonchi. Musculoskeletal:No midline cervical, thoracic or lumbar tenderness to palpation.  Neurologic:  Normal speech and language. No gross focal neurologic deficits are appreciated. Speech is normal.  Negative pronator drift.  No ataxia.  Normal  finger-to-nose.  No paresthesias.5/5 strength to bilateral upper and lower extremities.  Positive right Dix-Hallpike. Skin:  Skin is warm, dry and intact. No rash noted. Psychiatric: Mood and affect are normal. Speech and behavior are normal. Patient exhibits appropriate insight and judgment   ___________________________________________   LABS (all labs ordered are listed, but only abnormal results are displayed)  Labs Reviewed  PREGNANCY, URINE     PROCEDURES Procedures     INITIAL IMPRESSION / ASSESSMENT AND PLAN / ED COURSE  Pertinent labs & imaging results that were available during my care of the patient were reviewed by me and considered in my medical decision making (see chart for details).  Overall well-appearing patient.  Presenting for dizziness complaints.  No focal neurological deficit noted.  Doubt central vertigo cause.  Right side positive Dix-Hallpike, suspect benign positional vertigo.  Meclizine given.  Will treat with meclizine, rest, supportive care.  Education given.  Follow-up with ENT as needed.Discussed indication, risks and benefits of medications with patient.  Discussed proceed erectly to the ER for worsening complaints.  Discussed follow up with Primary care physician this week. Discussed follow up and return parameters including no resolution or any worsening concerns. Patient verbalized understanding and agreed to plan.   ____________________________________________   FINAL CLINICAL IMPRESSION(S) / ED DIAGNOSES  Final diagnoses:  Vertigo     ED Discharge Orders         Ordered    meclizine (ANTIVERT) 25 MG tablet  3 times daily PRN     02/19/19 1009           Note: This dictation was prepared with Dragon dictation along with smaller phrase technology. Any transcriptional errors that result from this process are unintentional.         02/21/19, NP 02/19/19 1029

## 2019-02-19 NOTE — Discharge Instructions (Signed)
Take medication as prescribed. Rest. Drink plenty of fluids. Slow position changes.   Follow-up with ear nose and throat for continued complaints.  Follow up with your primary care physician this week as needed. Return to Urgent care for new or worsening concerns.

## 2019-02-19 NOTE — ED Notes (Signed)
Pt states she feels safe for discharge, husband driving. Discharge instructions reviewed.

## 2019-02-19 NOTE — Telephone Encounter (Signed)
LVM for pt to call back.

## 2019-02-19 NOTE — ED Triage Notes (Addendum)
Pt reports upon awakening this AM to get ready for work, she felt very dizzy. Denies dizziness at rest, but when that when moving she begins to feel dizzy. Reports head "feels heavy". Pt denies any extremity weakness, speech appropriate. Pt alert and oriented X4, cooperative, RR even and unlabored, color WNL. Pt in NAD.

## 2019-02-21 LAB — CYTOLOGY - PAP
Chlamydia: NEGATIVE
Diagnosis: NEGATIVE
Molecular Disclaimer: NEGATIVE
Neisseria Gonorrhea: NEGATIVE

## 2019-02-21 NOTE — Progress Notes (Signed)
Negative, released to mychart

## 2019-02-27 DIAGNOSIS — E039 Hypothyroidism, unspecified: Secondary | ICD-10-CM | POA: Diagnosis not present

## 2019-02-27 DIAGNOSIS — H93293 Other abnormal auditory perceptions, bilateral: Secondary | ICD-10-CM | POA: Diagnosis not present

## 2019-02-27 DIAGNOSIS — H8120 Vestibular neuronitis, unspecified ear: Secondary | ICD-10-CM | POA: Diagnosis not present

## 2019-03-15 NOTE — Telephone Encounter (Signed)
Please get her scheduled

## 2019-03-19 ENCOUNTER — Other Ambulatory Visit: Payer: Self-pay

## 2019-03-19 ENCOUNTER — Ambulatory Visit (INDEPENDENT_AMBULATORY_CARE_PROVIDER_SITE_OTHER): Payer: BC Managed Care – PPO | Admitting: Family Medicine

## 2019-03-19 ENCOUNTER — Encounter: Payer: Self-pay | Admitting: Family Medicine

## 2019-03-19 VITALS — BP 165/113 | HR 94 | Temp 98.9°F

## 2019-03-19 DIAGNOSIS — R030 Elevated blood-pressure reading, without diagnosis of hypertension: Secondary | ICD-10-CM

## 2019-03-19 DIAGNOSIS — N926 Irregular menstruation, unspecified: Secondary | ICD-10-CM

## 2019-03-19 NOTE — Progress Notes (Signed)
BP (!) 165/113   Pulse 94   Temp 98.9 F (37.2 C)   SpO2 98%    Subjective:    Patient ID: Kiara Franklin, female    DOB: 06-05-1995, 23 y.o.   MRN: [id number]  HPI: Kiara Franklin is a 23 y.o. female  Chief Complaint  Patient presents with  . Amenorrhea    Last period was 02/02/19 or 02/03/19, stopped ocp 01/30/19   Patient here today for pregnancy confirmation. Stopped her oral contraceptives last month, and LMP was around 02/02/2019. Has taken several home pregnancy tests which have all been positive. Having some mild nausea but otherwise feeling well. Denies discharge, spotting, cramping, vomiting. Not currently on a prenatal because the one provided by GYN caused GI sxs for her. Requesting recommendations.   Has had some high blood pressures at times over the past few years noted at her medical appts. Has been using a home monitor the past few weeks and states her readings have been 90s/60s-110/70s. She feels the stress of coming into the doctor's office is causing the spikes but that it's never elevated at home. Denies CP, SOB, HAs, visual changes, syncope, dizziness.   Relevant past medical, surgical, family and social history reviewed and updated as indicated. Interim medical history since our last visit reviewed. Allergies and medications reviewed and updated.  Review of Systems  Per HPI unless specifically indicated above     Objective:    BP (!) 165/113   Pulse 94   Temp 98.9 F (37.2 C)   SpO2 98%   Wt Readings from Last 3 Encounters:  02/19/19 147 lb (66.7 kg)  02/16/19 150 lb (68 kg)  01/30/19 145 lb (65.8 kg)    Physical Exam Vitals signs and nursing note reviewed.  Constitutional:      Appearance: Normal appearance. She is not ill-appearing.  HENT:     Head: Atraumatic.  Eyes:     Extraocular Movements: Extraocular movements intact.     Conjunctiva/sclera: Conjunctivae normal.  Neck:     Musculoskeletal: Normal range of motion and neck supple.   Cardiovascular:     Rate and Rhythm: Normal rate and regular rhythm.     Heart sounds: Normal heart sounds.  Pulmonary:     Effort: Pulmonary effort is normal.     Breath sounds: Normal breath sounds.  Musculoskeletal: Normal range of motion.  Skin:    General: Skin is warm and dry.  Neurological:     Mental Status: She is alert and oriented to person, place, and time.  Psychiatric:        Mood and Affect: Mood normal.        Thought Content: Thought content normal.        Judgment: Judgment normal.     Results for orders placed or performed in visit on 03/19/19  Beta hCG quant (ref lab)  Result Value Ref Range   hCG Quant 32,148 mIU/mL      Assessment & Plan:   Problem List Items Addressed This Visit      Other   Elevated blood pressure reading    Will continue to closely monitor as she is followed by OBGYN for her pregnancy, but will hold off for now starting pregnancy safe medication due to reported home readings being on low end of normal. Encouraged her to log readings daily at home and call if getting elevated readings at any point. Also encouraged her to bring her home monitor either into our office or to  her OB appts to check against manual reading to ensure accuracy. DASH diet, stress control reviewed       Other Visit Diagnoses    Missed period    -  Primary   + Home pregnancy tests. Will obtain beta hcg level. Already scheduled for first OB visit. Prenatal vitamin recommendations given, lifestyle reviewed   Relevant Orders   Beta hCG quant (ref lab) (Completed)     Greater than 25 minutes spent today in direct patient care and counseling.   Follow up plan: Return for as needed for elevated BPs.

## 2019-03-20 LAB — BETA HCG QUANT (REF LAB): hCG Quant: 32148 m[IU]/mL

## 2019-03-23 DIAGNOSIS — R030 Elevated blood-pressure reading, without diagnosis of hypertension: Secondary | ICD-10-CM | POA: Insufficient documentation

## 2019-03-23 DIAGNOSIS — O10919 Unspecified pre-existing hypertension complicating pregnancy, unspecified trimester: Secondary | ICD-10-CM | POA: Insufficient documentation

## 2019-03-23 NOTE — Assessment & Plan Note (Signed)
Will continue to closely monitor as she is followed by OBGYN for her pregnancy, but will hold off for now starting pregnancy safe medication due to reported home readings being on low end of normal. Encouraged her to log readings daily at home and call if getting elevated readings at any point. Also encouraged her to bring her home monitor either into our office or to her OB appts to check against manual reading to ensure accuracy. DASH diet, stress control reviewed

## 2019-03-27 ENCOUNTER — Encounter: Payer: Self-pay | Admitting: Family Medicine

## 2019-03-27 ENCOUNTER — Telehealth: Payer: Self-pay | Admitting: Family Medicine

## 2019-03-27 ENCOUNTER — Ambulatory Visit (INDEPENDENT_AMBULATORY_CARE_PROVIDER_SITE_OTHER): Payer: BC Managed Care – PPO | Admitting: Family Medicine

## 2019-03-27 ENCOUNTER — Other Ambulatory Visit: Payer: Self-pay

## 2019-03-27 DIAGNOSIS — L237 Allergic contact dermatitis due to plants, except food: Secondary | ICD-10-CM

## 2019-03-27 MED ORDER — TRIAMCINOLONE ACETONIDE 0.1 % EX CREA: 1.0000 "application " | TOPICAL_CREAM | Freq: Two times a day (BID) | CUTANEOUS | 0 refills | Status: DC

## 2019-03-27 NOTE — Telephone Encounter (Signed)
I have already responded to her message but please call and make sure she got the message and schedule her if she would like to be scheduled as soon as possible  Copied from [place] [phone]. Topic: General - Other >> Mar 27, 2019  8:10 AM Rayann Heman wrote: Reason for CRM: pt called and stated that she sent a mychart this message this morning . Pt states that she is also pregnant and would like to check the status. Pt thinks she has poison oak and would like to know if she needs an appointment.

## 2019-03-27 NOTE — Telephone Encounter (Signed)
Pt seen this morning.

## 2019-03-27 NOTE — Progress Notes (Signed)
There were no vitals taken for this visit.   Subjective:    Patient ID: Kiara Franklin, female    DOB: 01/09/1996, 23 y.o.   MRN: [id number]  HPI: Kiara Franklin is a 23 y.o. female  Chief Complaint  Patient presents with  . Rash    . This visit was completed via WebEx due to the restrictions of the COVID-19 pandemic. All issues as above were discussed and addressed. Physical exam was done as above through visual confirmation on WebEx. If it was felt that the patient should be evaluated in the office, they were directed there. The patient verbally consented to this visit. . Location of the patient: work . Location of the provider: home . Those involved with this call:  . Provider: Roosvelt Maser, PA-C . CMA: Tiffany Reel, CMA . Front Desk/Registration: Harriet Pho  . Time spent on call: 15 minutes with patient face to face via video conference. More than 50% of this time was spent in counseling and coordination of care. 5 minutes total spent in review of patient's record and preparation of their chart. I verified patient identity using two factors (patient name and date of birth). Patient consents verbally to being seen via telemedicine visit today.   Patient presenting with itchy rash that she believes to be poison ivy on forehead and left cheek since yesterday. Was working outside over weekend and thinks she got into a plant at that time. Denies fever, drainage, new foods or soap products. Trying OTC calamine with minimal relief. Is currently pregnant in first trimester so nervous about taking anything without recommendations.   Relevant past medical, surgical, family and social history reviewed and updated as indicated. Interim medical history since our last visit reviewed. Allergies and medications reviewed and updated.  Review of Systems  Per HPI unless specifically indicated above     Objective:    There were no vitals taken for this visit.  Wt Readings from Last 3  Encounters:  02/19/19 147 lb (66.7 kg)  02/16/19 150 lb (68 kg)  01/30/19 145 lb (65.8 kg)    Physical Exam Vitals signs and nursing note reviewed.  Constitutional:      General: She is not in acute distress.    Appearance: Normal appearance.  HENT:     Head: Atraumatic.     Right Ear: External ear normal.     Left Ear: External ear normal.     Nose: Nose normal. No congestion.     Mouth/Throat:     Mouth: Mucous membranes are moist.     Pharynx: Oropharynx is clear. No posterior oropharyngeal erythema.  Eyes:     Extraocular Movements: Extraocular movements intact.     Conjunctiva/sclera: Conjunctivae normal.  Neck:     Musculoskeletal: Normal range of motion.  Cardiovascular:     Comments: Unable to assess via virtual visit Pulmonary:     Effort: Pulmonary effort is normal. No respiratory distress.  Musculoskeletal: Normal range of motion.  Skin:    General: Skin is dry.     Findings: Erythema and rash (blistered maculopapular patches on forehead and left cheek) present.  Neurological:     Mental Status: She is alert and oriented to person, place, and time.  Psychiatric:        Mood and Affect: Mood normal.        Thought Content: Thought content normal.        Judgment: Judgment normal.     Results for orders placed or performed  in visit on 03/19/19  Beta hCG quant (ref lab)  Result Value Ref Range   hCG Quant 32,148 mIU/mL      Assessment & Plan:   Problem List Items Addressed This Visit    None    Visit Diagnoses    Allergic contact dermatitis due to plants, except food    -  Primary   Triamcinolone cream sent, may use OTC antihistamines and calamine prn. F/u if not improving       Follow up plan: Return if symptoms worsen or fail to improve.

## 2019-03-27 NOTE — Telephone Encounter (Signed)
Called pt, no answer, left vm °

## 2019-03-28 ENCOUNTER — Encounter: Payer: Self-pay | Admitting: Obstetrics and Gynecology

## 2019-03-28 ENCOUNTER — Ambulatory Visit (INDEPENDENT_AMBULATORY_CARE_PROVIDER_SITE_OTHER): Payer: BC Managed Care – PPO | Admitting: Obstetrics and Gynecology

## 2019-03-28 ENCOUNTER — Other Ambulatory Visit: Payer: Self-pay | Admitting: Obstetrics and Gynecology

## 2019-03-28 VITALS — BP 144/105 | Wt 155.0 lb

## 2019-03-28 DIAGNOSIS — Z3490 Encounter for supervision of normal pregnancy, unspecified, unspecified trimester: Secondary | ICD-10-CM | POA: Insufficient documentation

## 2019-03-28 DIAGNOSIS — Z23 Encounter for immunization: Secondary | ICD-10-CM | POA: Diagnosis not present

## 2019-03-28 DIAGNOSIS — R030 Elevated blood-pressure reading, without diagnosis of hypertension: Secondary | ICD-10-CM

## 2019-03-28 DIAGNOSIS — Z3A08 8 weeks gestation of pregnancy: Secondary | ICD-10-CM

## 2019-03-28 DIAGNOSIS — O26891 Other specified pregnancy related conditions, first trimester: Secondary | ICD-10-CM

## 2019-03-28 DIAGNOSIS — Z3401 Encounter for supervision of normal first pregnancy, first trimester: Secondary | ICD-10-CM | POA: Diagnosis not present

## 2019-03-28 DIAGNOSIS — Z1379 Encounter for other screening for genetic and chromosomal anomalies: Secondary | ICD-10-CM | POA: Diagnosis not present

## 2019-03-28 DIAGNOSIS — O0990 Supervision of high risk pregnancy, unspecified, unspecified trimester: Secondary | ICD-10-CM | POA: Insufficient documentation

## 2019-03-28 NOTE — Patient Instructions (Signed)
First Trimester of Pregnancy The first trimester of pregnancy is from week 1 until the end of week 13 (months 1 through 3). A week after a sperm fertilizes an egg, the egg will implant on the wall of the uterus. This embryo will begin to develop into a baby. Genes from you and your partner will form the baby. The female genes will determine whether the baby will be a boy or a girl. At 6-8 weeks, the eyes and face will be formed, and the heartbeat can be seen on ultrasound. At the end of 12 weeks, all the baby's organs will be formed. Now that you are pregnant, you will want to do everything you can to have a healthy baby. Two of the most important things are to get good prenatal care and to follow your health care provider's instructions. Prenatal care is all the medical care you receive before the baby's birth. This care will help prevent, find, and treat any problems during the pregnancy and childbirth. Body changes during your first trimester Your body goes through many changes during pregnancy. The changes vary from woman to woman.  You may gain or lose a couple of pounds at first.  You may feel sick to your stomach (nauseous) and you may throw up (vomit). If the vomiting is uncontrollable, call your health care provider.  You may tire easily.  You may develop headaches that can be relieved by medicines. All medicines should be approved by your health care provider.  You may urinate more often. Painful urination may mean you have a bladder infection.  You may develop heartburn as a result of your pregnancy.  You may develop constipation because certain hormones are causing the muscles that push stool through your intestines to slow down.  You may develop hemorrhoids or swollen veins (varicose veins).  Your breasts may begin to grow larger and become tender. Your nipples may stick out more, and the tissue that surrounds them (areola) may become darker.  Your gums may bleed and may be  sensitive to brushing and flossing.  Dark spots or blotches (chloasma, mask of pregnancy) may develop on your face. This will likely fade after the baby is born.  Your menstrual periods will stop.  You may have a loss of appetite.  You may develop cravings for certain kinds of food.  You may have changes in your emotions from day to day, such as being excited to be pregnant or being concerned that something may go wrong with the pregnancy and baby.  You may have more vivid and strange dreams.  You may have changes in your hair. These can include thickening of your hair, rapid growth, and changes in texture. Some women also have hair loss during or after pregnancy, or hair that feels dry or thin. Your hair will most likely return to normal after your baby is born. What to expect at prenatal visits During a routine prenatal visit:  You will be weighed to make sure you and the baby are growing normally.  Your blood pressure will be taken.  Your abdomen will be measured to track your baby's growth.  The fetal heartbeat will be listened to between weeks 10 and 14 of your pregnancy.  Test results from any previous visits will be discussed. Your health care provider may ask you:  How you are feeling.  If you are feeling the baby move.  If you have had any abnormal symptoms, such as leaking fluid, bleeding, severe headaches, or abdominal   cramping.  If you are using any tobacco products, including cigarettes, chewing tobacco, and electronic cigarettes.  If you have any questions. Other tests that may be performed during your first trimester include:  Blood tests to find your blood type and to check for the presence of any previous infections. The tests will also be used to check for low iron levels (anemia) and protein on red blood cells (Rh antibodies). Depending on your risk factors, or if you previously had diabetes during pregnancy, you may have tests to check for high blood sugar  that affects pregnant women (gestational diabetes).  Urine tests to check for infections, diabetes, or protein in the urine.  An ultrasound to confirm the proper growth and development of the baby.  Fetal screens for spinal cord problems (spina bifida) and Down syndrome.  HIV (human immunodeficiency virus) testing. Routine prenatal testing includes screening for HIV, unless you choose not to have this test.  You may need other tests to make sure you and the baby are doing well. Follow these instructions at home: Medicines  Follow your health care provider's instructions regarding medicine use. Specific medicines may be either safe or unsafe to take during pregnancy.  Take a prenatal vitamin that contains at least 600 micrograms (mcg) of folic acid.  If you develop constipation, try taking a stool softener if your health care provider approves. Eating and drinking   Eat a balanced diet that includes fresh fruits and vegetables, whole grains, good sources of protein such as meat, eggs, or tofu, and low-fat dairy. Your health care provider will help you determine the amount of weight gain that is right for you.  Avoid raw meat and uncooked cheese. These carry germs that can cause birth defects in the baby.  Eating four or five small meals rather than three large meals a day may help relieve nausea and vomiting. If you start to feel nauseous, eating a few soda crackers can be helpful. Drinking liquids between meals, instead of during meals, also seems to help ease nausea and vomiting.  Limit foods that are high in fat and processed sugars, such as fried and sweet foods.  To prevent constipation: ? Eat foods that are high in fiber, such as fresh fruits and vegetables, whole grains, and beans. ? Drink enough fluid to keep your urine clear or pale yellow. Activity  Exercise only as directed by your health care provider. Most women can continue their usual exercise routine during  pregnancy. Try to exercise for 30 minutes at least 5 days a week. Exercising will help you: ? Control your weight. ? Stay in shape. ? Be prepared for labor and delivery.  Experiencing pain or cramping in the lower abdomen or lower back is a good sign that you should stop exercising. Check with your health care provider before continuing with normal exercises.  Try to avoid standing for long periods of time. Move your legs often if you must stand in one place for a long time.  Avoid heavy lifting.  Wear low-heeled shoes and practice good posture.  You may continue to have sex unless your health care provider tells you not to. Relieving pain and discomfort  Wear a good support bra to relieve breast tenderness.  Take warm sitz baths to soothe any pain or discomfort caused by hemorrhoids. Use hemorrhoid cream if your health care provider approves.  Rest with your legs elevated if you have leg cramps or low back pain.  If you develop varicose veins in   your legs, wear support hose. Elevate your feet for 15 minutes, 3-4 times a day. Limit salt in your diet. Prenatal care  Schedule your prenatal visits by the twelfth week of pregnancy. They are usually scheduled monthly at first, then more often in the last 2 months before delivery.  Write down your questions. Take them to your prenatal visits.  Keep all your prenatal visits as told by your health care provider. This is important. Safety  Wear your seat belt at all times when driving.  Make a list of emergency phone numbers, including numbers for family, friends, the hospital, and police and fire departments. General instructions  Ask your health care provider for a referral to a local prenatal education class. Begin classes no later than the beginning of month 6 of your pregnancy.  Ask for help if you have counseling or nutritional needs during pregnancy. Your health care provider can offer advice or refer you to specialists for help  with various needs.  Do not use hot tubs, steam rooms, or saunas.  Do not douche or use tampons or scented sanitary pads.  Do not cross your legs for long periods of time.  Avoid cat litter boxes and soil used by cats. These carry germs that can cause birth defects in the baby and possibly loss of the fetus by miscarriage or stillbirth.  Avoid all smoking, herbs, alcohol, and medicines not prescribed by your health care provider. Chemicals in these products affect the formation and growth of the baby.  Do not use any products that contain nicotine or tobacco, such as cigarettes and e-cigarettes. If you need help quitting, ask your health care provider. You may receive counseling support and other resources to help you quit.  Schedule a dentist appointment. At home, brush your teeth with a soft toothbrush and be gentle when you floss. Contact a health care provider if:  You have dizziness.  You have mild pelvic cramps, pelvic pressure, or nagging pain in the abdominal area.  You have persistent nausea, vomiting, or diarrhea.  You have a bad smelling vaginal discharge.  You have pain when you urinate.  You notice increased swelling in your face, hands, legs, or ankles.  You are exposed to fifth disease or chickenpox.  You are exposed to German measles (rubella) and have never had it. Get help right away if:  You have a fever.  You are leaking fluid from your vagina.  You have spotting or bleeding from your vagina.  You have severe abdominal cramping or pain.  You have rapid weight gain or loss.  You vomit blood or material that looks like coffee grounds.  You develop a severe headache.  You have shortness of breath.  You have any kind of trauma, such as from a fall or a car accident. Summary  The first trimester of pregnancy is from week 1 until the end of week 13 (months 1 through 3).  Your body goes through many changes during pregnancy. The changes vary from  woman to woman.  You will have routine prenatal visits. During those visits, your health care provider will examine you, discuss any test results you may have, and talk with you about how you are feeling. This information is not intended to replace advice given to you by your health care provider. Make sure you discuss any questions you have with your health care provider. Document Released: 05/11/2001 Document Revised: 04/29/2017 Document Reviewed: 04/28/2016 Elsevier Patient Education  2020 [employer]  

## 2019-03-28 NOTE — Progress Notes (Deleted)
New Obstetric Patient H&P   Chief Complaint: "Desires prenatal care"   History of Present Illness: Patient is a 23 y.o. G1P0000 Not Hispanic or Latino female, LMP *** presents with amenorrhea and positive home pregnancy test. Based on her  LMP, her EDD is Estimated Date of Delivery: None noted. and her EGA is Unknown. Cycles are {0-35:19561} {days/wks/mos/yrs:310907}, {Desc; regular/irreg:14544}, and occur approximately every : {numbers 22-35:14824} days. Her last pap smear was {numbers (fuzzy):14653} years ago and was {Findings; lab pap smear results:16707::"no abnormalities"}.    She had a urine pregnancy test which was positive {numbers (fuzzy):14653} {time frame:9076}  ago. Her last menstrual period was normal and lasted for  {numbers (fuzzy):14653} {time frame:9076}. Since her LMP she claims she has experienced ***. She denies vaginal bleeding. Her past medical history is {Noncontribuatory/Contributory:21644}. Her prior pregnancies are notable for {pregnancy complications:12320}  Since her LMP, she admits to the use of tobacco products  {yes/no:63} She claims she has gained   {inf wt change:14817} pounds since the start of her pregnancy.  There are cats in the home in the home  {yes/no:63} If yes {Desc; indoor/outdoor:13239} She admits close contact with children on a regular basis  {yes/no:63}  She has had chicken pox in the past {yes/no/unknown:74} She has had Tuberculosis exposures, symptoms, or previously tested positive for TB   {yes/no:63} Current or past history of domestic violence. {yes/no:63}  Genetic Screening/Teratology Counseling: (Includes patient, baby's father, or anyone in either family with:)   1. Patient's age >/= 37 at [facility]  {yes/no:63} 2. Thalassemia ([place], [place], [place], or Asian background): MCV<80  {yes/no:63} 3. Neural tube defect (meningomyelocele, spina bifida, anencephaly)  {yes/no:63} 4. Congenital heart defect  {yes/no:63}  5. Down syndrome   {yes/no:63} 6. Tay-Sachs (Jewish, [place])  {yes/no:63} 7. Canavan's Disease  {yes/no:63} 8. Sickle cell disease or trait (African)  {yes/no:63}  9. Hemophilia or other blood disorders  {yes/no:63}  10. Muscular dystrophy  {yes/no:63}  11. Cystic fibrosis  {yes/no:63}  12. Huntington's Chorea  {yes/no:63}  13. Mental retardation/autism  {yes/no:63} 14. Other inherited genetic or chromosomal disorder  {yes/no:63} 15. Maternal metabolic disorder (DM, PKU, etc)  {yes/no:63} 16. Patient or FOB with a child with a birth defect not listed above no  16a. Patient or FOB with a birth defect themselves {yes/no:63} 9. Recurrent pregnancy loss, or stillbirth  {yes/no:63}  18. Any medications since LMP other than prenatal vitamins (include vitamins, supplements, OTC meds, drugs, alcohol)  {yes/no:63} 19. Any other genetic/environmental exposure to discuss  {yes/no:63}  Infection History:   1. Lives with someone with TB or TB exposed  {yes/no:63}  2. Patient or partner has history of genital herpes  {yes/no:63} 3. Rash or viral illness since LMP  {yes/no:63} 4. History of STI (GC, CT, HPV, syphilis, HIV)  {yes/no:63} 5. History of recent travel :  {yes/no:63}  Other pertinent information:  {yes/no:63}     Review of Systems:10 point review of systems negative unless otherwise noted in HPI  Past Medical History:  Diagnosis Date  . Acne     Past Surgical History:  Procedure Laterality Date  . KNEE SURGERY Left 11/2012    Gynecologic History: Patient's last menstrual period was 02/02/2019 (exact date).  Obstetric History: G1P0000  Family History  Problem Relation Age of Onset  . Cancer Neg Hx   . COPD Neg Hx   . Diabetes Neg Hx   . Heart disease Neg Hx   . Stroke Neg Hx     Social History  Socioeconomic History  . Marital status: Married    Spouse name: Not on file  . Number of children: Not on file  . Years of education: Not on file  . Highest education level:  Not on file  Occupational History  . Not on file  Social Needs  . Financial resource strain: Not on file  . Food insecurity    Worry: Not on file    Inability: Not on file  . Transportation needs    Medical: Not on file    Non-medical: Not on file  Tobacco Use  . Smoking status: Never Smoker  . Smokeless tobacco: Never Used  Substance and Sexual Activity  . Alcohol use: No  . Drug use: No  . Sexual activity: Yes    Birth control/protection: None  Lifestyle  . Physical activity    Days per week: Not on file    Minutes per session: Not on file  . Stress: Not on file  Relationships  . Social Musician on phone: Not on file    Gets together: Not on file    Attends religious service: Not on file    Active member of club or organization: Not on file    Attends meetings of clubs or organizations: Not on file    Relationship status: Not on file  . Intimate partner violence    Fear of current or ex partner: Not on file    Emotionally abused: Not on file    Physically abused: Not on file    Forced sexual activity: Not on file  Other Topics Concern  . Not on file  Social History Narrative  . Not on file    No Known Allergies  Prior to Admission medications   Medication Sig Start Date End Date Taking? Authorizing Provider  meclizine (ANTIVERT) 25 MG tablet Take 1 tablet (25 mg total) by mouth 3 (three) times daily as needed for dizziness. 02/19/19   Renford Dills, NP  triamcinolone cream (KENALOG) 0.1 % Apply 1 application topically 2 (two) times daily. 03/27/19   Particia Nearing, PA-C    Physical Exam LMP 02/02/2019 (Exact Date)   Physical Exam   Female Chaperone present during breast and/or pelvic exam.   Assessment: 23 y.o. G1P0000 at Unknown presenting to initiate prenatal care  Plan: 1) Avoid alcoholic beverages. 2) Patient encouraged not to smoke.  3) Discontinue the use of all non-medicinal drugs and chemicals.  4) Take prenatal vitamins  daily.  5) Nutrition, food safety (fish, cheese advisories, and high nitrite foods) and exercise discussed. 6) Hospital and practice style discussed with cross coverage system.  7) Genetic Screening, such as with 1st Trimester Screening, cell free fetal DNA, AFP testing, and Ultrasound, as well as with amniocentesis and CVS as appropriate, is discussed with patient. At the conclusion of today's visit patient {Desc; requested/declined/undecided:14580} genetic testing 8) Patient is asked about travel to areas at risk for the Zika virus, and counseled to avoid travel and exposure to mosquitoes or sexual partners who may have themselves been exposed to the virus. Testing is discussed, and will be ordered as appropriate.   Thomasene Mohair, MD 03/28/2019 2:29 PM

## 2019-03-28 NOTE — Progress Notes (Signed)
New Obstetric Patient H&P   Chief Complaint: "Desires prenatal care"   History of Present Illness: Patient is a 23 y.o. G1P0000 Not Hispanic or [place] female, LMP 01/25/2019 presents with amenorrhea and positive home pregnancy test. Based on her  LMP, her EDD is Estimated Date of Delivery: 11/01/19 and her EGA is [redacted]w[redacted]d. Cycles are 5. days, regular, and occur approximately every : 28 days. Her last pap smear was one month ago and was no abnormalities.    She had a urine pregnancy test which was positive 2 week(s)  ago. Since her LMP she claims she has experienced no issues. She denies vaginal bleeding. Her past medical history is notable for possible hypertension.  Her home measurements are very normal.  This is her first pregnancy.  Since her LMP, she admits to the use of tobacco products  no She claims she has gained zero pounds since the start of her pregnancy.  There are cats in the home in the home  no  She admits close contact with children on a regular basis  yes  She has had chicken pox in the past no She has had Tuberculosis exposures, symptoms, or previously tested positive for TB   no Current or past history of domestic violence. no  Genetic Screening/Teratology Counseling: (Includes patient, baby's father, or anyone in either family with:)   23. Patient's age >/= 2 at [facility]  no 2. Thalassemia ([place], [place], [place], or Asian background): MCV<80  no 3. Neural tube defect (meningomyelocele, spina bifida, anencephaly)  no 4. Congenital heart defect  no  5. Down syndrome  no 6. Tay-Sachs (Jewish, [place])  no 7. Canavan's Disease  no 8. Sickle cell disease or trait (African)  no  9. Hemophilia or other blood disorders  no  10. Muscular dystrophy  no  11. Cystic fibrosis  no  12. Huntington's Chorea  no  13. Mental retardation/autism  no 14. Other inherited genetic or chromosomal disorder  no 15. Maternal metabolic disorder (DM, PKU, etc)  no 16. Patient or FOB  with a child with a birth defect not listed above no  16a. Patient or FOB with a birth defect themselves no 17. Recurrent pregnancy loss, or stillbirth  no  18. Any medications since LMP other than prenatal vitamins (include vitamins, supplements, OTC meds, drugs, alcohol)  no 19. Any other genetic/environmental exposure to discuss  no  Infection History:   1. Lives with someone with TB or TB exposed  no  2. Patient or partner has history of genital herpes  no 3. Rash or viral illness since LMP  no 4. History of STI (GC, CT, HPV, syphilis, HIV)  no 5. History of recent travel :  no  Other pertinent information:  no     Review of Systems:10 point review of systems negative unless otherwise noted in HPI  Past Medical History:  Diagnosis Date  . Acne     Past Surgical History:  Procedure Laterality Date  . KNEE SURGERY Left 11/2012    Gynecologic History: Patient's last menstrual period was 01/30/2019.  Obstetric History: G1P0000  Family History  Problem Relation Age of Onset  . Cancer Neg Hx   . COPD Neg Hx   . Diabetes Neg Hx   . Heart disease Neg Hx   . Stroke Neg Hx     Social History   Socioeconomic History  . Marital status: Married    Spouse name: Not on file  . Number of children: Not on  file  . Years of education: Not on file  . Highest education level: Not on file  Occupational History  . Not on file  Social Needs  . Financial resource strain: Not on file  . Food insecurity    Worry: Not on file    Inability: Not on file  . Transportation needs    Medical: Not on file    Non-medical: Not on file  Tobacco Use  . Smoking status: Never Smoker  . Smokeless tobacco: Never Used  Substance and Sexual Activity  . Alcohol use: No  . Drug use: No  . Sexual activity: Yes    Birth control/protection: None  Lifestyle  . Physical activity    Days per week: Not on file    Minutes per session: Not on file  . Stress: Not on file  Relationships  .  Social Musician on phone: Not on file    Gets together: Not on file    Attends religious service: Not on file    Active member of club or organization: Not on file    Attends meetings of clubs or organizations: Not on file    Relationship status: Not on file  . Intimate partner violence    Fear of current or ex partner: Not on file    Emotionally abused: Not on file    Physically abused: Not on file    Forced sexual activity: Not on file  Other Topics Concern  . Not on file  Social History Narrative  . Not on file    No Known Allergies  Prior to Admission medications   Medication Sig Start Date End Date Taking? Authorizing Provider  meclizine (ANTIVERT) 25 MG tablet Take 1 tablet (25 mg total) by mouth 3 (three) times daily as needed for dizziness. 02/19/19  Yes Renford Dills, NP  triamcinolone cream (KENALOG) 0.1 % Apply 1 application topically 2 (two) times daily. 03/27/19  Yes Particia Nearing, PA-C    Physical Exam BP (!) 144/105   Wt 155 lb (70.3 kg)   LMP 01/30/2019   BMI 26.61 kg/m   Physical Exam Vitals signs reviewed.  Constitutional:      General: She is not in acute distress.    Appearance: Normal appearance. She is well-developed.  HENT:     Head: Normocephalic and atraumatic.  Eyes:     General: No scleral icterus.    Conjunctiva/sclera: Conjunctivae normal.  Neck:     Musculoskeletal: Normal range of motion and neck supple.     Thyroid: No thyromegaly.  Cardiovascular:     Rate and Rhythm: Normal rate and regular rhythm.     Heart sounds: Normal heart sounds. No murmur. No friction rub. No gallop.   Pulmonary:     Effort: Pulmonary effort is normal.     Breath sounds: Normal breath sounds. No wheezing.  Abdominal:     General: There is no distension.     Palpations: Abdomen is soft. There is no mass.     Tenderness: There is no abdominal tenderness. There is no guarding or rebound.     Hernia: No hernia is present.   Musculoskeletal: Normal range of motion.        General: No swelling.  Skin:    General: Skin is warm and dry.     Findings: No rash.  Neurological:     General: No focal deficit present.     Mental Status: She is alert and oriented to person,  place, and time.     Cranial Nerves: No cranial nerve deficit.  Psychiatric:        Mood and Affect: Mood normal.        Behavior: Behavior normal.        Judgment: Judgment normal.     BSUS: Single, living IUP measuring roughly 4972w3d gestation with a cardiac rate of 162 bpm Visibility on this ultrasound was quite limited.   Assessment: 23 y.o. G1P0000 at 7127w6d presenting to initiate prenatal care  Plan: 1) Avoid alcoholic beverages. 2) Patient encouraged not to smoke.  3) Discontinue the use of all non-medicinal drugs and chemicals.  4) Take prenatal vitamins daily.  5) Nutrition, food safety (fish, cheese advisories, and high nitrite foods) and exercise discussed. 6) Hospital and practice style discussed with cross coverage system.  7) Genetic Screening, such as with 1st Trimester Screening, cell free fetal DNA, AFP testing, and Ultrasound, as well as with amniocentesis and CVS as appropriate, is discussed with patient. At the conclusion of today's visit patient undecided genetic testing 8) Patient is asked about travel to areas at risk for the [place] virus, and counseled to avoid travel and exposure to mosquitoes or sexual partners who may have themselves been exposed to the virus. Testing is discussed, and will be ordered as appropriate.  9) possible hypertension:  Will draw baseline labs, just in case.  Will continue to monitor. In review of the patient's clinic blood pressures, they have been quite high. She reports a log of BPs she kept at home that was very normal.  May consider baby ASA after 12 weeks depending on how her BPs are trending.   Thomasene MohairStephen Shawndale Kilpatrick, MD 03/28/2019 2:37 PM

## 2019-03-29 LAB — RPR+RH+ABO+RUB AB+AB SCR+CB...
Antibody Screen: NEGATIVE
HIV Screen 4th Generation wRfx: NONREACTIVE
Hematocrit: 39.2 % (ref 34.0–46.6)
Hemoglobin: 13.3 g/dL (ref 11.1–15.9)
Hepatitis B Surface Ag: NEGATIVE
MCH: 29.6 pg (ref 26.6–33.0)
MCHC: 33.9 g/dL (ref 31.5–35.7)
MCV: 87 fL (ref 79–97)
Platelets: 396 10*3/uL (ref 150–450)
RBC: 4.49 x10E6/uL (ref 3.77–5.28)
RDW: 13.1 % (ref 11.7–15.4)
RPR Ser Ql: NONREACTIVE
Rh Factor: POSITIVE
Rubella Antibodies, IGG: <0.9 index — ABNORMAL LOW (ref >0.99)
Varicella zoster IgG: >4000 index (ref >165)
WBC: 14.2 10*3/uL — ABNORMAL HIGH (ref 3.4–10.8)

## 2019-03-29 LAB — COMPREHENSIVE METABOLIC PANEL
ALT: 10 IU/L (ref 0–32)
AST: 15 IU/L (ref 0–40)
Albumin/Globulin Ratio: 2 (ref 1.2–2.2)
Albumin: 4.3 g/dL (ref 3.9–5.0)
Alkaline Phosphatase: 57 IU/L (ref 39–117)
BUN/Creatinine Ratio: 20 (ref 9–23)
BUN: 12 mg/dL (ref 6–20)
Bilirubin Total: 0.3 mg/dL (ref 0.0–1.2)
CO2: 20 mmol/L (ref 20–29)
Calcium: 9.7 mg/dL (ref 8.7–10.2)
Chloride: 103 mmol/L (ref 96–106)
Creatinine, Ser: 0.59 mg/dL (ref 0.57–1.00)
GFR calc Af Amer: 148 mL/min/{1.73_m2} (ref >59)
GFR calc non Af Amer: 129 mL/min/{1.73_m2} (ref >59)
Globulin, Total: 2.1 g/dL (ref 1.5–4.5)
Glucose: 95 mg/dL (ref 65–99)
Potassium: 4.1 mmol/L (ref 3.5–5.2)
Sodium: 135 mmol/L (ref 134–144)
Total Protein: 6.4 g/dL (ref 6.0–8.5)

## 2019-03-29 LAB — HEMOGLOBINOPATHY EVALUATION
HGB C: 0 %
HGB S: 0 %
HGB VARIANT: 0 %
Hemoglobin A2 Quantitation: 2.6 % (ref 1.8–3.2)
Hemoglobin F Quantitation: 0 % (ref 0.0–2.0)
Hgb A: 97.4 % (ref 96.4–98.8)

## 2019-03-29 LAB — URINE DRUG PANEL 7
Amphetamines, Urine: NEGATIVE ng/mL
Barbiturate Quant, Ur: NEGATIVE ng/mL
Benzodiazepine Quant, Ur: NEGATIVE ng/mL
Cannabinoid Quant, Ur: NEGATIVE ng/mL
Cocaine (Metab.): NEGATIVE ng/mL
Opiate Quant, Ur: NEGATIVE ng/mL
PCP Quant, Ur: NEGATIVE ng/mL

## 2019-03-31 LAB — SPECIMEN STATUS REPORT

## 2019-03-31 LAB — URINE CULTURE

## 2019-04-04 ENCOUNTER — Ambulatory Visit (INDEPENDENT_AMBULATORY_CARE_PROVIDER_SITE_OTHER): Payer: BC Managed Care – PPO

## 2019-04-04 ENCOUNTER — Other Ambulatory Visit: Payer: Self-pay

## 2019-04-04 ENCOUNTER — Encounter: Payer: Self-pay | Admitting: Obstetrics and Gynecology

## 2019-04-04 ENCOUNTER — Ambulatory Visit (INDEPENDENT_AMBULATORY_CARE_PROVIDER_SITE_OTHER): Payer: BC Managed Care – PPO | Admitting: Obstetrics and Gynecology

## 2019-04-04 ENCOUNTER — Encounter: Payer: BC Managed Care – PPO | Admitting: Advanced Practice Midwife

## 2019-04-04 VITALS — BP 142/100 | Wt 152.0 lb

## 2019-04-04 DIAGNOSIS — O3481 Maternal care for other abnormalities of pelvic organs, first trimester: Secondary | ICD-10-CM

## 2019-04-04 DIAGNOSIS — Z3401 Encounter for supervision of normal first pregnancy, first trimester: Secondary | ICD-10-CM

## 2019-04-04 DIAGNOSIS — N8311 Corpus luteum cyst of right ovary: Secondary | ICD-10-CM | POA: Diagnosis not present

## 2019-04-04 DIAGNOSIS — Z3A08 8 weeks gestation of pregnancy: Secondary | ICD-10-CM

## 2019-04-04 NOTE — Progress Notes (Signed)
Routine Prenatal Care Visit  Subjective  Kiara Franklin is a 23 y.o. G1P0000 at [redacted]w[redacted]d being seen today for ongoing prenatal care.  She is currently monitored for the following issues for this low-risk pregnancy and has Elevated blood pressure reading and Supervision of normal pregnancy on their problem list.  ----------------------------------------------------------------------------------- Patient reports no complaints.   Contractions: Not present. Vag. Bleeding: None.  Movement: Absent. Denies leaking of fluid.  ----------------------------------------------------------------------------------- The following portions of the patient's history were reviewed and updated as appropriate: allergies, current medications, past family history, past medical history, past social history, past surgical history and problem list. Problem list updated.   Objective  Blood pressure (!) 142/100, weight 152 lb (68.9 kg), last menstrual period 01/25/2019. Pregravid weight 147 lb (66.7 kg) Total Weight Gain 5 lb (2.268 kg) Urinalysis:      Fetal Status: Fetal Heart Rate (bpm): 175   Movement: Absent     General:  Alert, oriented and cooperative. Patient is in no acute distress.  Skin: Skin is warm and dry. No rash noted.   Cardiovascular: Normal heart rate noted  Respiratory: Normal respiratory effort, no problems with respiration noted  Abdomen: Soft, gravid, appropriate for gestational age. Pain/Pressure: Absent     Pelvic:  Cervical exam deferred        Extremities: Normal range of motion.     Mental Status: Normal mood and affect. Normal behavior. Normal judgment and thought content.     Assessment   23 y.o. G1P0000 at [redacted]w[redacted]d by  11/09/2019, by Ultrasound presenting for routine prenatal visit  Plan   Pregnancy#1 Problems (from 01/30/19 to present)    Problem Noted Resolved   Supervision of normal pregnancy 03/28/2019 by Will Bonnet, MD No   Overview Addendum 04/04/2019  1:24 PM by  Homero Fellers, [place] Prenatal Labs  Dating  8 wk [place] Blood type: A/Positive/-- (10/28 1542)   Genetic Screen 1 Screen:     AFP:      Quad:      NIPS:    Antibody:Negative (10/28 1542)  Anatomic [place]  Rubella: <0.90 not immune Varicella: Immune  GTT Early:        28 wk:      RPR: Non Reactive (10/28 1542)   Rhogam  not needed HBsAg: Negative (10/28 1542)   TDaP vaccine                       HIV: Non Reactive (10/28 1542)   Flu Shot   03/28/2019                             GBS:   Contraception  Pap: NIL  CBB     CS/VBAC    Baby Food    Support Person                Gestational age appropriate obstetric precautions including but not limited to vaginal bleeding, contractions, leaking of fluid and fetal movement were reviewed in detail with the patient.    Return in about 2 weeks (around 04/18/2019) for ROB in person with MD.  Patient brought home cuff, BPs range from  96/48- 152/90 on the monitor Home cuff  BP in office: 140/92 Office cuff BP in office: 138/96  Encouraged that the readings are the same with both devices. Will review log next visit and can determine if she would  benefit from BP medication- patient is open to this if needed  Discussed initiation of 81 mg ASA after 12 weeks   Adelene Idler MD [facility], [facility] 04/04/2019 12:36 PM

## 2019-04-04 NOTE — Patient Instructions (Addendum)
Initial steps to help :   B6 (pyridoxine) 25 mg,  3-4 times a day Unisom (doxylamine) 25 mg at bedtime **B6 and Unisom are available as a combination prescription medications called diclegis and bonjesta  B1 (thiamin)  50-100 mg 1-4 a day  Continue prenatal vitamin with iron and thiamin. If it is not tolerated switch to 1 mg of folic acid.  Can add medication for gastric reflux if needed.  Subsequent steps to be added to B1, B6, and Unisom:  1. Antihistamine (one of the following medications) Dramamine      25-50 mg every 4-6 hours Benadryl      25-50 mg every 4-6 hours Meclizine      25 mg every 6 hours  2. Dopamine Antagonist (one of the following medications) Metoclopramide  (Reglan)  5-10 mg every 6-8 hours         PO Promethazine   (Phenergan)   12.5-25 mg every 4-6 hours      PO or rectal Prochlorperazine  (Compazine)  5-10 mg every 6-8 hours     25mg BID rectally    Subsequent steps if there has still not been improvement in symptoms:  3. Daily stool softner  4. Ondansetron  (Zofran)   4-8 mg every 6-8 hours   

## 2019-04-04 NOTE — Progress Notes (Signed)
ROB/US C/o no appetite, nausea and vomiting in the morning to midday   Denies lof, no vb

## 2019-04-05 LAB — INHERITEST CORE(CF97,SMA,FRAX)

## 2019-04-05 LAB — PROTEIN / CREATININE RATIO, URINE
Creatinine, Urine: 64.4 mg/dL
Protein, Ur: 5 mg/dL
Protein/Creat Ratio: 78 mg/g creat (ref 0–200)

## 2019-04-06 NOTE — Progress Notes (Signed)
WNL- released to mychart

## 2019-04-08 ENCOUNTER — Other Ambulatory Visit: Payer: Self-pay

## 2019-04-08 ENCOUNTER — Ambulatory Visit
Admission: EM | Admit: 2019-04-08 | Discharge: 2019-04-08 | Disposition: A | Discharge status: Home / Self Care | Payer: BC Managed Care – PPO | Attending: Family Medicine | Admitting: Family Medicine

## 2019-04-08 ENCOUNTER — Telehealth: Payer: BC Managed Care – PPO

## 2019-04-08 ENCOUNTER — Encounter: Payer: Self-pay | Admitting: Emergency Medicine

## 2019-04-08 DIAGNOSIS — L509 Urticaria, unspecified: Secondary | ICD-10-CM

## 2019-04-08 NOTE — ED Triage Notes (Signed)
Patient in today c/o rash on her legs. Patient states she had poison ivy 2 weeks ago and PCP gave her steroid cream to use. Patient is [redacted] weeks pregnant.

## 2019-04-08 NOTE — ED Provider Notes (Signed)
[facility]    CSN: [id number] Arrival date & time: 04/08/19  1246      History   Chief Complaint Chief Complaint  Patient presents with  . Rash    APPT    HPI Kiara Franklin is a 23 y.o. female.   23 yo female with a c/o itchy rash on her legs. States she had poison ivy dermatitis several weeks ago and was treated with topical steroid cream. States that rash resolved and that this rash is different. States rash seems to come and go and worse at night. Has not taken any medication. Patient states she's [redacted] weeks pregnant. Denies any fevers, chills, bleeding or other symptoms.      Past Medical History:  Diagnosis Date  . Acne     Patient Active Problem List   Diagnosis Date Noted  . Supervision of normal pregnancy 03/28/2019  . Elevated blood pressure reading 03/23/2019    Past Surgical History:  Procedure Laterality Date  . KNEE SURGERY Left 11/2012    OB History    Gravida  1   Para  0   Term  0   Preterm  0   AB  0   Living  0     SAB  0   TAB  0   Ectopic  0   Multiple  0   Live Births  0            Home Medications    Prior to Admission medications   Medication Sig Start Date End Date Taking? Authorizing Provider  Prenatal Vit-Fe Fumarate-FA (MULTIVITAMIN-PRENATAL) 27-0.8 MG TABS tablet Take 1 tablet by mouth daily at 12 noon.   Yes [provider]  meclizine (ANTIVERT) 25 MG tablet Take 1 tablet (25 mg total) by mouth 3 (three) times daily as needed for dizziness. Patient not taking: Reported on 04/04/2019 02/19/19   Marylene Land, NP  triamcinolone cream (KENALOG) 0.1 % Apply 1 application topically 2 (two) times daily. 03/27/19   Volney American, PA-C    Family History Family History  Problem Relation Age of Onset  . Asthma Mother   . Healthy Father   . Thyroid disease Maternal Aunt   . Cancer Maternal Grandmother        unknown type  . COPD Neg Hx   . Diabetes Neg Hx   . Heart disease Neg Hx    . Stroke Neg Hx     Social History Social History   Tobacco Use  . Smoking status: Never Smoker  . Smokeless tobacco: Never Used  Substance Use Topics  . Alcohol use: No  . Drug use: No     Allergies   Patient has no known allergies.   Review of Systems Review of Systems   Physical Exam Triage Vital Signs ED Triage Vitals [04/08/19 1300]  Enc Vitals Group     BP 137/87     Pulse Rate (!) 103     Resp 18     Temp 98.9 F (37.2 C)     Temp Source Oral     SpO2 100 %     Weight 150 lb (68 kg)     Height 5\' 4"  (1.626 m)     Head Circumference      Peak Flow      Pain Score 0     Pain Loc      Pain Edu?      Excl. in [place]?    No data  found.  Updated Vital Signs BP 137/87 (BP Location: Left Arm)   Pulse (!) 103   Temp 98.9 F (37.2 C) (Oral)   Resp 18   Ht 5\' 4"  (1.626 m)   Wt 68 kg   LMP 01/25/2019 (Approximate)   SpO2 100%   BMI 25.75 kg/m   Visual Acuity Right Eye Distance:   Left Eye Distance:   Bilateral Distance:    Right Eye Near:   Left Eye Near:    Bilateral Near:     Physical Exam Vitals signs and nursing note reviewed.  Constitutional:      General: She is not in acute distress.    Appearance: Normal appearance. She is not ill-appearing, toxic-appearing or diaphoretic.  Skin:    Findings: Rash present. Rash is urticarial (lesions on legs).  Neurological:     Mental Status: She is alert.      UC Treatments / Results  Labs (all labs ordered are listed, but only abnormal results are displayed) Labs Reviewed - No data to display  EKG   Radiology No results found.  Procedures Procedures (including critical care time)  Medications Ordered in UC Medications - No data to display  Initial Impression / Assessment and Plan / UC Course  I have reviewed the triage vital signs and the nursing notes.  Pertinent labs & imaging results that were available during my care of the patient were reviewed by me and considered in my  medical decision making (see chart for details).      Final Clinical Impressions(s) / UC Diagnoses   Final diagnoses:  Urticaria    ED Prescriptions    None      1.diagnosis reviewed with patient 2 .Recommend supportive treatment with otc antihistamine as needed; monitor 3. Follow-up prn   PDMP not reviewed this encounter.   01/27/2019, MD 04/08/19 1751

## 2019-04-18 ENCOUNTER — Ambulatory Visit (INDEPENDENT_AMBULATORY_CARE_PROVIDER_SITE_OTHER): Payer: BC Managed Care – PPO | Admitting: Obstetrics and Gynecology

## 2019-04-18 ENCOUNTER — Other Ambulatory Visit: Payer: Self-pay

## 2019-04-18 ENCOUNTER — Encounter: Payer: Self-pay | Admitting: Obstetrics and Gynecology

## 2019-04-18 VITALS — BP 140/82 | Wt 155.0 lb

## 2019-04-18 DIAGNOSIS — Z1379 Encounter for other screening for genetic and chromosomal anomalies: Secondary | ICD-10-CM | POA: Diagnosis not present

## 2019-04-18 DIAGNOSIS — R030 Elevated blood-pressure reading, without diagnosis of hypertension: Secondary | ICD-10-CM

## 2019-04-18 DIAGNOSIS — Z3401 Encounter for supervision of normal first pregnancy, first trimester: Secondary | ICD-10-CM

## 2019-04-18 DIAGNOSIS — O99891 Other specified diseases and conditions complicating pregnancy: Secondary | ICD-10-CM

## 2019-04-18 DIAGNOSIS — Z3A10 10 weeks gestation of pregnancy: Secondary | ICD-10-CM

## 2019-04-18 NOTE — Progress Notes (Signed)
Routine Prenatal Care Visit  Subjective  Kiara Franklin is a 23 y.o. G1P0000 at [redacted]w[redacted]d being seen today for ongoing prenatal care.  She is currently monitored for the following issues for this low-risk pregnancy and has Elevated blood pressure reading and Supervision of normal pregnancy on their problem list.  ----------------------------------------------------------------------------------- Patient reports blacking out over the weekend after getting out of the shower. She occasionally feels dizzy. She did not go to the ER. She has no symptoms now.     Contractions: Not present. Vag. Bleeding: None.  Movement: Absent. Leaking Fluid denies.  ----------------------------------------------------------------------------------- The following portions of the patient's history were reviewed and updated as appropriate: allergies, current medications, past family history, past medical history, past social history, past surgical history and problem list. Problem list updated.  Objective  Blood pressure 140/82, weight 155 lb (70.3 kg), last menstrual period 01/25/2019. Pregravid weight 147 lb (66.7 kg) Total Weight Gain 8 lb (3.629 kg) Urinalysis: Urine Protein    Urine Glucose    Fetal Status: Fetal Heart Rate (bpm): 160   Movement: Absent     General:  Alert, oriented and cooperative. Patient is in no acute distress.  Skin: Skin is warm and dry. No rash noted.   Cardiovascular: Normal heart rate noted  Respiratory: Normal respiratory effort, no problems with respiration noted  Abdomen: Soft, gravid, appropriate for gestational age. Pain/Pressure: Absent     Pelvic:  Cervical exam deferred        Extremities: Normal range of motion.     Mental Status: Normal mood and affect. Normal behavior. Normal judgment and thought content.   Assessment   23 y.o. G1P0000 at [redacted]w[redacted]d by  11/09/2019, by Ultrasound presenting for routine prenatal visit  Plan   Pregnancy#1 Problems (from 01/30/19 to present)    Problem Noted Resolved   Supervision of normal pregnancy 03/28/2019 by Will Bonnet, MD No   Overview Addendum 04/04/2019  1:24 PM by Homero Fellers, [place] Prenatal Labs  Dating  8 wk [place] Blood type: A/Positive/-- (10/28 1542)   Genetic Screen 1 Screen:     AFP:      Quad:      NIPS:    Antibody:Negative (10/28 1542)  Anatomic [place]  Rubella: <0.90 not immune Varicella: Immune  GTT Early:        28 wk:      RPR: Non Reactive (10/28 1542)   Rhogam  not needed HBsAg: Negative (10/28 1542)   TDaP vaccine                       HIV: Non Reactive (10/28 1542)   Flu Shot   03/28/2019                             GBS:   Contraception  Pap: NIL  CBB     CS/VBAC    Baby Food    Support Person             Elevated blood pressure reading 03/23/2019 by Volney American, PA-C No   Overview Signed 04/19/2019  1:26 PM by Will Bonnet, MD    Blood pressures elevated in clinic. She checks her BPs daily at home and they have been normal. She has brought her home BP machine to clinic and the values are about the same. Encouraged daily BP checks Start bASA after 12 weeks, just  in case. Normal baseline labs.          Preterm labor symptoms and general obstetric precautions including but not limited to vaginal bleeding, contractions, leaking of fluid and fetal movement were reviewed in detail with the patient. Please refer to After Visit Summary for other counseling recommendations.   - Note: she brought a log of home BPs that she is taking on her home machine.  The values that she gets at home are normal.  At her last visit she brought her blood pressure cuff and the value that was measured on her blood pressure cuff was the same as that measured by the CMA.  We will continue to monitor.  However, we will also encourage her to continue to take home blood pressure readings.  Just to be safe we will start her on baby aspirin after [redacted] weeks gestation.  As  pregnancy continues we can decide whether she requires being treated as hypertensive or not.  Though, based on her home values it does not appear she does have hypertension.  Syncopal episode: Encourage patient to go to the emergency room if another episode occurred.  Etiologies could be normal response to pregnancy physiology changes, cardiac physiology, neurologic physiology.  These are unable to be determined in the clinic very easily.  - NIPT today.  Return in about 4 weeks (around 05/16/2019) for Routine Prenatal Appointment.  Thomasene Mohair, MD, [facility], [facility] 04/19/2019 1:27 PM

## 2019-04-19 ENCOUNTER — Encounter: Payer: Self-pay | Admitting: Obstetrics and Gynecology

## 2019-04-23 LAB — MATERNIT 21 PLUS CORE, BLOOD
Fetal Fraction: 3
Result (T21): NEGATIVE
Trisomy 13 (Patau syndrome): NEGATIVE
Trisomy 18 (Edwards syndrome): NEGATIVE
Trisomy 21 (Down syndrome): NEGATIVE

## 2019-04-25 ENCOUNTER — Telehealth: Payer: Self-pay

## 2019-04-25 NOTE — Telephone Encounter (Signed)
Gabriel Rainwater, pt's friend, calling to confirm sex of baby as she is hosting a gender reveal party this weekend.  [phone]  Confirmed gender.

## 2019-05-17 ENCOUNTER — Ambulatory Visit (INDEPENDENT_AMBULATORY_CARE_PROVIDER_SITE_OTHER): Payer: BC Managed Care – PPO | Admitting: Obstetrics and Gynecology

## 2019-05-17 ENCOUNTER — Other Ambulatory Visit: Payer: Self-pay

## 2019-05-17 ENCOUNTER — Encounter: Payer: Self-pay | Admitting: Obstetrics and Gynecology

## 2019-05-17 VITALS — BP 134/84 | Wt 159.0 lb

## 2019-05-17 DIAGNOSIS — O26892 Other specified pregnancy related conditions, second trimester: Secondary | ICD-10-CM

## 2019-05-17 DIAGNOSIS — Z3401 Encounter for supervision of normal first pregnancy, first trimester: Secondary | ICD-10-CM

## 2019-05-17 DIAGNOSIS — Z3A14 14 weeks gestation of pregnancy: Secondary | ICD-10-CM

## 2019-05-17 DIAGNOSIS — R030 Elevated blood-pressure reading, without diagnosis of hypertension: Secondary | ICD-10-CM

## 2019-05-17 NOTE — Progress Notes (Signed)
Routine Prenatal Care Visit  Subjective  Kiara Franklin is a 23 y.o. G1P0000 at [redacted]w[redacted]d being seen today for ongoing prenatal care.  She is currently monitored for the following issues for this low-risk pregnancy and has Elevated blood pressure reading and Supervision of normal pregnancy on their problem list.  ----------------------------------------------------------------------------------- Patient reports no complaints.   Contractions: Not present. Vag. Bleeding: None.  Movement: Absent. Leaking Fluid denies.  ----------------------------------------------------------------------------------- The following portions of the patient's history were reviewed and updated as appropriate: allergies, current medications, past family history, past medical history, past social history, past surgical history and problem list. Problem list updated.  Objective  Blood pressure 134/84, weight 159 lb (72.1 kg), last menstrual period 01/25/2019. Pregravid weight 147 lb (66.7 kg) Total Weight Gain 12 lb (5.443 kg) Urinalysis: Urine Protein    Urine Glucose    Fetal Status: Fetal Heart Rate (bpm): 150   Movement: Absent     General:  Alert, oriented and cooperative. Patient is in no acute distress.  Skin: Skin is warm and dry. No rash noted.   Cardiovascular: Normal heart rate noted  Respiratory: Normal respiratory effort, no problems with respiration noted  Abdomen: Soft, gravid, appropriate for gestational age. Pain/Pressure: Absent     Pelvic:  Cervical exam deferred        Extremities: Normal range of motion.     Mental Status: Normal mood and affect. Normal behavior. Normal judgment and thought content.   Assessment   23 y.o. G1P0000 at [redacted]w[redacted]d by  11/09/2019, by Ultrasound presenting for routine prenatal visit  Plan   Pregnancy#1 Problems (from 01/30/19 to present)    Problem Noted Resolved   Supervision of normal pregnancy 03/28/2019 by Will Bonnet, MD No   Overview Addendum 04/04/2019   1:24 PM by Homero Fellers, [place] Prenatal Labs  Dating  8 wk [place] Blood type: A/Positive/-- (10/28 1542)   Genetic Screen 1 Screen:     AFP:      Quad:      NIPS:    Antibody:Negative (10/28 1542)  Anatomic [place]  Rubella: <0.90 not immune Varicella: Immune  GTT Early:        28 wk:      RPR: Non Reactive (10/28 1542)   Rhogam  not needed HBsAg: Negative (10/28 1542)   TDaP vaccine                       HIV: Non Reactive (10/28 1542)   Flu Shot   03/28/2019                             GBS:   Contraception  Pap: NIL  CBB     CS/VBAC    Baby Food    Support Person             Elevated blood pressure reading 03/23/2019 by Volney American, PA-C No   Overview Signed 04/19/2019  1:26 PM by Will Bonnet, MD    Blood pressures elevated in clinic. She checks her BPs daily at home and they have been normal. She has brought her home BP machine to clinic and the values are about the same. Encouraged daily BP checks Start bASA after 12 weeks, just in case. Normal baseline labs.          Preterm labor symptoms and general obstetric precautions including but not limited to  vaginal bleeding, contractions, leaking of fluid and fetal movement were reviewed in detail with the patient. Please refer to After Visit Summary for other counseling recommendations.   Return in about 4 weeks (around 06/14/2019) for Anatomy u/s and routine prenatal.  Thomasene Mohair, MD, [facility], [facility] 05/17/2019 4:42 PM

## 2019-06-01 NOTE — L&D Delivery Note (Signed)
Delivery Note Primary OB: [facility] Delivery Physician: Annamarie Major, MD Gestational Age: Full term Antepartum complications: chronic hypertension Intrapartum complications: None  A viable Female was delivered via vertex presentation. "Kiara Franklin" Apgars:8 ,9  Weight:  pending .   Placenta status: spontaneous and Intact.  Cord: 3+ vessels;  with the following complications: nuchal.  Anesthesia:  epidural Episiotomy:  none Lacerations:  1st and periurethral Suture Repair: 2.0 vicryl Est. Blood Loss (mL):  500 mL  Mom to postpartum.  Baby to Couplet care / Skin to Skin.  Annamarie Major, MD, [facility], [facility] 11/03/2019  6:10 AM [phone]

## 2019-06-13 ENCOUNTER — Ambulatory Visit (INDEPENDENT_AMBULATORY_CARE_PROVIDER_SITE_OTHER): Payer: BC Managed Care – PPO | Admitting: Obstetrics and Gynecology

## 2019-06-13 ENCOUNTER — Encounter: Payer: Self-pay | Admitting: Obstetrics and Gynecology

## 2019-06-13 ENCOUNTER — Other Ambulatory Visit: Payer: Self-pay

## 2019-06-13 ENCOUNTER — Ambulatory Visit (INDEPENDENT_AMBULATORY_CARE_PROVIDER_SITE_OTHER): Payer: BC Managed Care – PPO

## 2019-06-13 VITALS — BP 146/96 | Wt 163.0 lb

## 2019-06-13 DIAGNOSIS — Z3A18 18 weeks gestation of pregnancy: Secondary | ICD-10-CM

## 2019-06-13 DIAGNOSIS — Z363 Encounter for antenatal screening for malformations: Secondary | ICD-10-CM | POA: Diagnosis not present

## 2019-06-13 DIAGNOSIS — O10912 Unspecified pre-existing hypertension complicating pregnancy, second trimester: Secondary | ICD-10-CM

## 2019-06-13 DIAGNOSIS — Z3402 Encounter for supervision of normal first pregnancy, second trimester: Secondary | ICD-10-CM

## 2019-06-13 DIAGNOSIS — Z3401 Encounter for supervision of normal first pregnancy, first trimester: Secondary | ICD-10-CM

## 2019-06-13 DIAGNOSIS — O10919 Unspecified pre-existing hypertension complicating pregnancy, unspecified trimester: Secondary | ICD-10-CM

## 2019-06-13 NOTE — Progress Notes (Signed)
Routine Prenatal Care Visit  Subjective  Kiara Franklin is a 24 y.o. G1P0000 at [redacted]w[redacted]d being seen today for ongoing prenatal care.  She is currently monitored for the following issues for this high-risk pregnancy and has Chronic hypertension affecting pregnancy and Supervision of normal pregnancy on their problem list.  ----------------------------------------------------------------------------------- Patient reports no complaints.   Contractions: Not present. Vag. Bleeding: None.  Movement: Absent. Leaking Fluid denies.  Anatomy u/s normal today. ----------------------------------------------------------------------------------- The following portions of the patient's history were reviewed and updated as appropriate: allergies, current medications, past family history, past medical history, past social history, past surgical history and problem list. Problem list updated.  Objective  Blood pressure (!) 146/96, weight 163 lb (73.9 kg), last menstrual period 01/25/2019. Pregravid weight 147 lb (66.7 kg) Total Weight Gain 16 lb (7.258 kg) Urinalysis: Urine Protein    Urine Glucose    Fetal Status: Fetal Heart Rate (bpm): Present(normal, [place])   Movement: Absent     General:  Alert, oriented and cooperative. Patient is in no acute distress.  Skin: Skin is warm and dry. No rash noted.   Cardiovascular: Normal heart rate noted  Respiratory: Normal respiratory effort, no problems with respiration noted  Abdomen: Soft, gravid, appropriate for gestational age. Pain/Pressure: Absent     Pelvic:  Cervical exam deferred        Extremities: Normal range of motion.     Mental Status: Normal mood and affect. Normal behavior. Normal judgment and thought content.   Imaging Results Anatomy U/S  Result Date: 06/13/2019 Patient Name: Syndey Jaskolski DOB: 1996-02-03 MRN: [id number] ULTRASOUND REPORT Location: [facility] Date of Service: 06/13/2019 Indications:Anatomy Ultrasound Findings: [name]  intrauterine pregnancy is visualized with FHR at 152 BPM. Biometrics give an (U/S) Gestational age of [redacted]w[redacted]d and an (U/S) EDD of 11/11/2019; this correlates with the clinically established Estimated Date of Delivery: 11/09/2019. Fetal presentation is Bree[place]. EFW: 243 g ( 9 oz ). Placenta: posterior. Grade: 1 AFI: subjectively normal. Anatomic survey is complete and normal; Gender - female.  Impression: 1. [redacted]w[redacted]d Viable Singleton Intrauterine pregnancy by U/S. 2. (U/S) EDD is consistent with Clinically established Estimated Date of Delivery: 11/09/19 . 3. Normal Anatomy Scan Gweneth Dimitri, RT There is a singleton gestation with subjectively normal amniotic fluid volume. The fetal biometry correlates with established dating. Detailed evaluation of the fetal anatomy was performed.The fetal anatomical survey appears within normal limits within the resolution of ultrasound as described above.  It must be noted that a normal ultrasound is unable to rule out fetal aneuploidy nor is it able to detect all possible malformations.   The ultrasound images and findings were reviewed by me and I agree with the above report. Prentice Docker, MD, [facility], [facility] 06/13/2019 10:53 AM       Assessment   24 y.o. G1P0000 at [redacted]w[redacted]d by  11/09/2019, by Ultrasound presenting for routine prenatal visit  Plan   Pregnancy#1 Problems (from 01/30/19 to present)    Problem Noted Resolved   Supervision of normal pregnancy 03/28/2019 by Will Bonnet, MD No   Overview Addendum 04/04/2019  1:24 PM by Homero Fellers, [place] Prenatal Labs  Dating  8 wk [place] Blood type: A/Positive/-- (10/28 1542)   Genetic Screen 1 Screen:     AFP:      Quad:      NIPS:    Antibody:Negative (10/28 1542)  Anatomic [place]  Rubella: <0.90 not immune Varicella:  Immune  GTT Early:        28 wk:      RPR: Non Reactive (10/28 1542)   Rhogam  not needed HBsAg: Negative (10/28 1542)   TDaP vaccine                        HIV: Non Reactive (10/28 1542)   Flu Shot   03/28/2019                             GBS:   Contraception  Pap: NIL  CBB     CS/VBAC    Baby Food    Support Person             Chronic hypertension affecting pregnancy 03/23/2019 by Particia Nearing, PA-C No   Overview Addendum 06/13/2019 10:55 AM by Conard Novak, MD    Blood pressures elevated in clinic. She checks her BPs daily at home and they have been normal. She has brought her home BP machine to clinic and the values are about the same. Encouraged daily BP checks Start bASA after 12 weeks, just in case. Normal baseline labs.  Treat like cHTN [x]  Aspirin 81 mg daily after 12 weeks; discontinue after 36 weeks [x]  baseline labs with CBC, CMP, urine protein/creatinine ratio [ ]  no BP meds unless BPs become elevated [ ]  ultrasound for growth at 28, 32, 36 weeks [ ]  Baseline EKG   Current antihypertensives:  None   Baseline and surveillance labs (pulled in from [facility], refresh links as needed)  Lab Results  Component Value Date   PLT 396 03/28/2019   CREATININE 0.59 03/28/2019   AST 15 03/28/2019   ALT 10 03/28/2019    Antenatal Testing CHTN - O10.919  Group I  BP < 140/90, no preeclampsia, AGA,  nml AFV, +/- meds    Group II BP > 140/90, on meds, no preeclampsia, AGA, nml AFV  20-28-34-38  20-24-28-32-35-38  32//2 x wk  28//BPP wkly then 32//2 x wk  40 no meds; 39 meds  PRN or 37  Pre-eclampsia  GHTN - O13.9/Preeclampsia without severe features  - O14.00   Preeclampsia with severe features - O14.10  Q 3-4wks  Q 2 wks  28//BPP wkly then 32//2 x wk  Inpatient  37  PRN or 34             Preterm labor symptoms and general obstetric precautions including but not limited to vaginal bleeding, contractions, leaking of fluid and fetal movement were reviewed in detail with the patient. Please refer to After Visit Summary for other counseling recommendations.   Return in about  4 weeks (around 07/11/2019) for Routine Prenatal Appointment.  03/30/2019, MD, 08-28-2004 OB/[facility]N, [facility] 06/13/2019 11:09 AM

## 2019-07-11 ENCOUNTER — Ambulatory Visit (INDEPENDENT_AMBULATORY_CARE_PROVIDER_SITE_OTHER): Payer: BC Managed Care – PPO | Admitting: Obstetrics and Gynecology

## 2019-07-11 ENCOUNTER — Other Ambulatory Visit: Payer: Self-pay

## 2019-07-11 ENCOUNTER — Encounter: Payer: BC Managed Care – PPO | Admitting: Obstetrics and Gynecology

## 2019-07-11 VITALS — BP 130/90 | Wt 164.0 lb

## 2019-07-11 DIAGNOSIS — O09892 Supervision of other high risk pregnancies, second trimester: Secondary | ICD-10-CM

## 2019-07-11 DIAGNOSIS — Z3A22 22 weeks gestation of pregnancy: Secondary | ICD-10-CM

## 2019-07-11 DIAGNOSIS — O10919 Unspecified pre-existing hypertension complicating pregnancy, unspecified trimester: Secondary | ICD-10-CM

## 2019-07-11 DIAGNOSIS — O10912 Unspecified pre-existing hypertension complicating pregnancy, second trimester: Secondary | ICD-10-CM

## 2019-07-11 LAB — POCT URINALYSIS DIPSTICK OB
Glucose, UA: NEGATIVE
POC,PROTEIN,UA: NEGATIVE

## 2019-07-11 NOTE — Progress Notes (Signed)
ROB- no concerns 

## 2019-07-11 NOTE — Progress Notes (Signed)
  Subjective  Fetal Movement? yes Contractions? no Leaking Fluid? no Vaginal Bleeding? no PNVs? Yes  BP at home in 120/70s. Monitors regularly. Gets nervous when coming to appts. Had 1 episode low BP with diastolic in 60s recently.  Has anxiety with tachycardia occas. Sx usually at work with work stress. Doesn't feel she needs meds, just wanted to mention.   Objective  BP 130/90   Wt 164 lb (74.4 kg)   LMP 01/25/2019 (Approximate)   BMI 28.15 kg/m  General: NAD Pulmonary: no increased work of breathing Abdomen: gravid, non-tender Extremities: no edema Psychiatric: mood appropriate, affect full  Assessment  24 y.o. G1P0000 at [redacted]w[redacted]d by  11/09/2019, by Ultrasound presenting for routine prenatal visit  Plan   Problem List Items Addressed This Visit    None     Labs: none  RTO 4 weeks  Cameran Ahmed B. Shavona Gunderman, PA-C [facility],  07/11/2019  11:14 AM

## 2019-07-11 NOTE — Patient Instructions (Signed)
I value your feedback and entrusting us with your care. If you get a [place] [place] patient survey, I would appreciate you taking the time to let us know about your experience today. Thank you!  As of May 10, 2019, your lab results will be released to your MyChart immediately, before I even have a chance to see them. Please give me time to review them and contact you if there are any abnormalities. Thank you for your patience.  

## 2019-08-08 ENCOUNTER — Other Ambulatory Visit: Payer: Self-pay

## 2019-08-08 ENCOUNTER — Ambulatory Visit (INDEPENDENT_AMBULATORY_CARE_PROVIDER_SITE_OTHER): Payer: BC Managed Care – PPO | Admitting: Obstetrics and Gynecology

## 2019-08-08 ENCOUNTER — Encounter: Payer: Self-pay | Admitting: Obstetrics and Gynecology

## 2019-08-08 VITALS — BP 128/84 | Wt 171.0 lb

## 2019-08-08 DIAGNOSIS — O10919 Unspecified pre-existing hypertension complicating pregnancy, unspecified trimester: Secondary | ICD-10-CM

## 2019-08-08 DIAGNOSIS — O10912 Unspecified pre-existing hypertension complicating pregnancy, second trimester: Secondary | ICD-10-CM

## 2019-08-08 DIAGNOSIS — Z3402 Encounter for supervision of normal first pregnancy, second trimester: Secondary | ICD-10-CM

## 2019-08-08 DIAGNOSIS — Z3A26 26 weeks gestation of pregnancy: Secondary | ICD-10-CM

## 2019-08-08 NOTE — Progress Notes (Signed)
Routine Prenatal Care Visit  Subjective  Kiara Franklin is a 24 y.o. G1P0000 at [redacted]w[redacted]d being seen today for ongoing prenatal care.  She is currently monitored for the following issues for this high-risk pregnancy and has Chronic hypertension affecting pregnancy and Supervision of normal pregnancy on their problem list.  ----------------------------------------------------------------------------------- Patient reports no complaints.   Contractions: Not present. Vag. Bleeding: None.  Movement: Present. Leaking Fluid denies.  ----------------------------------------------------------------------------------- The following portions of the patient's history were reviewed and updated as appropriate: allergies, current medications, past family history, past medical history, past social history, past surgical history and problem list. Problem list updated.  Objective  Blood pressure 128/84, weight 171 lb (77.6 kg), last menstrual period 01/25/2019. Pregravid weight 147 lb (66.7 kg) Total Weight Gain 24 lb (10.9 kg) Urinalysis: Urine Protein    Urine Glucose    Fetal Status: Fetal Heart Rate (bpm): 145 Fundal Height: 27 cm Movement: Present     General:  Alert, oriented and cooperative. Patient is in no acute distress.  Skin: Skin is warm and dry. No rash noted.   Cardiovascular: Normal heart rate noted  Respiratory: Normal respiratory effort, no problems with respiration noted  Abdomen: Soft, gravid, appropriate for gestational age. Pain/Pressure: Absent     Pelvic:  Cervical exam deferred        Extremities: Normal range of motion.     Mental Status: Normal mood and affect. Normal behavior. Normal judgment and thought content.   Assessment   24 y.o. G1P0000 at [redacted]w[redacted]d by  11/09/2019, by Ultrasound presenting for routine prenatal visit  Plan   Pregnancy#1 Problems (from 01/30/19 to present)    Problem Noted Resolved   Supervision of normal pregnancy 03/28/2019 by Will Bonnet, MD No   Overview Addendum 04/04/2019  1:24 PM by Homero Fellers, [place] Prenatal Labs  Dating  8 wk [place] Blood type: A/Positive/-- (10/28 1542)   Genetic Screen 1 Screen:     AFP:      Quad:      NIPS:    Antibody:Negative (10/28 1542)  Anatomic [place]  Rubella: <0.90 not immune Varicella: Immune  GTT Early:        28 wk:      RPR: Non Reactive (10/28 1542)   Rhogam  not needed HBsAg: Negative (10/28 1542)   TDaP vaccine                       HIV: Non Reactive (10/28 1542)   Flu Shot   03/28/2019                             GBS:   Contraception  Pap: NIL  CBB     CS/VBAC    Baby Food    Support Person             Chronic hypertension affecting pregnancy 03/23/2019 by Volney American, PA-C No   Overview Addendum 06/13/2019 10:55 AM by Will Bonnet, MD    Blood pressures elevated in clinic. She checks her BPs daily at home and they have been normal. She has brought her home BP machine to clinic and the values are about the same. Encouraged daily BP checks Start bASA after 12 weeks, just in case. Normal baseline labs.  Treat like cHTN [x]  Aspirin 81 mg daily after 12 weeks; discontinue after 36 weeks [x]  baseline labs with CBC,  CMP, urine protein/creatinine ratio [ ]  no BP meds unless BPs become elevated [ ]  ultrasound for growth at 28, 32, 36 weeks [ ]  Baseline EKG   Current antihypertensives:  None   Baseline and surveillance labs (pulled in from [facility], refresh links as needed)  Lab Results  Component Value Date   PLT 396 03/28/2019   CREATININE 0.59 03/28/2019   AST 15 03/28/2019   ALT 10 03/28/2019    Antenatal Testing CHTN - O10.919  Group I  BP < 140/90, no preeclampsia, AGA,  nml AFV, +/- meds    Group II BP > 140/90, on meds, no preeclampsia, AGA, nml AFV  20-28-34-38  20-24-28-32-35-38  32//2 x wk  28//BPP wkly then 32//2 x wk  40 no meds; 39 meds  PRN or 37  Pre-eclampsia  GHTN - O13.9/Preeclampsia without severe features   - O14.00   Preeclampsia with severe features - O14.10  Q 3-4wks  Q 2 wks  28//BPP wkly then 32//2 x wk  Inpatient  37  PRN or 34             Preterm labor symptoms and general obstetric precautions including but not limited to vaginal bleeding, contractions, leaking of fluid and fetal movement were reviewed in detail with the patient. Please refer to After Visit Summary for other counseling recommendations.   Return in about 1 week (around 08/15/2019) for U/S for growth, 28 week labs, routine prenatal.  03-01-1972, MD, 02-26-1989 OB/GYN, [facility] 08/08/2019 12:25 PM

## 2019-08-15 ENCOUNTER — Other Ambulatory Visit: Payer: Self-pay

## 2019-08-15 ENCOUNTER — Ambulatory Visit (INDEPENDENT_AMBULATORY_CARE_PROVIDER_SITE_OTHER): Payer: BC Managed Care – PPO | Admitting: Obstetrics and Gynecology

## 2019-08-15 VITALS — BP 130/80 | Wt 173.0 lb

## 2019-08-15 DIAGNOSIS — O10012 Pre-existing essential hypertension complicating pregnancy, second trimester: Secondary | ICD-10-CM | POA: Diagnosis not present

## 2019-08-15 DIAGNOSIS — O368120 Decreased fetal movements, second trimester, not applicable or unspecified: Secondary | ICD-10-CM

## 2019-08-15 DIAGNOSIS — Z3A27 27 weeks gestation of pregnancy: Secondary | ICD-10-CM

## 2019-08-15 DIAGNOSIS — O368130 Decreased fetal movements, third trimester, not applicable or unspecified: Secondary | ICD-10-CM

## 2019-08-15 DIAGNOSIS — Z3402 Encounter for supervision of normal first pregnancy, second trimester: Secondary | ICD-10-CM

## 2019-08-15 DIAGNOSIS — O09892 Supervision of other high risk pregnancies, second trimester: Secondary | ICD-10-CM

## 2019-08-15 NOTE — Patient Instructions (Signed)
Fetal Movement Counts Patient Name: ________________________________________________ Patient Due Date: ____________________ What is a fetal movement count?  A fetal movement count is the number of times that you feel your baby move during a certain amount of time. This may also be called a fetal kick count. A fetal movement count is recommended for every pregnant woman. You may be asked to start counting fetal movements as early as week 28 of your pregnancy. Pay attention to when your baby is most active. You may notice your baby's sleep and wake cycles. You may also notice things that make your baby move more. You should do a fetal movement count:  When your baby is normally most active.  At the same time each day. A good time to count movements is while you are resting, after having something to eat and drink. How do I count fetal movements? 1. Find a quiet, comfortable area. Sit, or lie down on your side. 2. Write down the date, the start time and stop time, and the number of movements that you felt between those two times. Take this information with you to your health care visits. 3. Write down your start time when you feel the first movement. 4. Count kicks, flutters, swishes, rolls, and jabs. You should feel at least 10 movements. 5. You may stop counting after you have felt 10 movements, or if you have been counting for 2 hours. Write down the stop time. 6. If you do not feel 10 movements in 2 hours, contact your health care provider for further instructions. Your health care provider may want to do additional tests to assess your baby's well-being. Contact a health care provider if:  You feel fewer than 10 movements in 2 hours.  Your baby is not moving like he or she usually does. Date: ____________ Start time: ____________ Stop time: ____________ Movements: ____________ Date: ____________ Start time: ____________ Stop time: ____________ Movements: ____________ Date: ____________  Start time: ____________ Stop time: ____________ Movements: ____________ Date: ____________ Start time: ____________ Stop time: ____________ Movements: ____________ Date: ____________ Start time: ____________ Stop time: ____________ Movements: ____________ Date: ____________ Start time: ____________ Stop time: ____________ Movements: ____________ Date: ____________ Start time: ____________ Stop time: ____________ Movements: ____________ Date: ____________ Start time: ____________ Stop time: ____________ Movements: ____________ Date: ____________ Start time: ____________ Stop time: ____________ Movements: ____________ This information is not intended to replace advice given to you by your health care provider. Make sure you discuss any questions you have with your health care provider. Document Revised: 01/04/2019 Document Reviewed: 01/04/2019 Elsevier Patient Education  2020 [employer]  

## 2019-08-15 NOTE — Progress Notes (Signed)
Ob problem visit C/o hasnt felt a lot of movement since Sunday, did check heartbeat at home with doppler  Denies lof, no vb,

## 2019-08-15 NOTE — Progress Notes (Signed)
Routine Prenatal Care Visit  Subjective  Kiara Franklin is a 24 y.o. G1P0000 at [redacted]w[redacted]d being seen today for ongoing prenatal care.  She is currently monitored for the following issues for this high-risk pregnancy and has Chronic hypertension affecting pregnancy and Supervision of normal pregnancy on their problem list.  ----------------------------------------------------------------------------------- Patient reports decreased movements.   Contractions: Not present. Vag. Bleeding: None.  Movement: (!) Decreased. Denies leaking of fluid.  ----------------------------------------------------------------------------------- The following portions of the patient's history were reviewed and updated as appropriate: allergies, current medications, past family history, past medical history, past social history, past surgical history and problem list. Problem list updated.   Objective  Blood pressure 130/80, weight 173 lb (78.5 kg), last menstrual period 01/25/2019. Pregravid weight 147 lb (66.7 kg) Total Weight Gain 26 lb (11.8 kg) Urinalysis:      Fetal Status: Fetal Heart Rate (bpm): 145   Movement: (!) Decreased     General:  Alert, oriented and cooperative. Patient is in no acute distress.  Skin: Skin is warm and dry. No rash noted.   Cardiovascular: Normal heart rate noted  Respiratory: Normal respiratory effort, no problems with respiration noted  Abdomen: Soft, gravid, appropriate for gestational age. Pain/Pressure: Absent     Pelvic:  Cervical exam deferred        Extremities: Normal range of motion.  Edema: None  Mental Status: Normal mood and affect. Normal behavior. Normal judgment and thought content.     Assessment   24 y.o. G1P0000 at [redacted]w[redacted]d by  11/09/2019, by Ultrasound presenting for routine prenatal visit  Plan   Pregnancy#1 Problems (from 01/30/19 to present)    Problem Noted Resolved   Supervision of normal pregnancy 03/28/2019 by Conard Novak, MD No   Overview Addendum 04/04/2019  1:24 PM by Natale Milch, MD      Clinic [facility] Prenatal Labs  Dating  8 wk [place] Blood type: A/Positive/-- (10/28 1542)   Genetic Screen 1 Screen:     AFP:      Quad:      NIPS:    Antibody:Negative (10/28 1542)  Anatomic [place]  Rubella: <0.90 not immune Varicella: Immune  GTT Early:        28 wk:      RPR: Non Reactive (10/28 1542)   Rhogam  not needed HBsAg: Negative (10/28 1542)   TDaP vaccine                       HIV: Non Reactive (10/28 1542)   Flu Shot   03/28/2019                             GBS:   Contraception  Pap: NIL  CBB     CS/VBAC    Baby Food    Support Person             Chronic hypertension affecting pregnancy 03/23/2019 by Particia Nearing, PA-C No   Overview Addendum 06/13/2019 10:55 AM by Conard Novak, MD    Blood pressures elevated in clinic. She checks her BPs daily at home and they have been normal. She has brought her home BP machine to clinic and the values are about the same. Encouraged daily BP checks Start bASA after 12 weeks, just in case. Normal baseline labs.  Treat like cHTN [x]  Aspirin 81 mg daily after 12 weeks; discontinue after 36 weeks [x]  baseline labs with  CBC, CMP, urine protein/creatinine ratio [ ]  no BP meds unless BPs become elevated [ ]  ultrasound for growth at 28, 32, 36 weeks [ ]  Baseline EKG   Current antihypertensives:  None   Baseline and surveillance labs (pulled in from [facility], refresh links as needed)  Lab Results  Component Value Date   PLT 396 03/28/2019   CREATININE 0.59 03/28/2019   AST 15 03/28/2019   ALT 10 03/28/2019    Antenatal Testing CHTN - O10.919  Group I  BP < 140/90, no preeclampsia, AGA,  nml AFV, +/- meds    Group II BP > 140/90, on meds, no preeclampsia, AGA, nml AFV  20-28-34-38  20-24-28-32-35-38  32//2 x wk  28//BPP wkly then 32//2 x wk  40 no meds; 39 meds  PRN or 37  Pre-eclampsia  GHTN - O13.9/Preeclampsia without severe features   - O14.00   Preeclampsia with severe features - O14.10  Q 3-4wks  Q 2 wks  28//BPP wkly then 32//2 x wk  Inpatient  37  PRN or 45             Gestational age appropriate obstetric precautions including but not limited to vaginal bleeding, contractions, leaking of fluid and fetal movement were reviewed in detail with the patient.    Discussed fetal kick counts and how to perform them  Return in about 1 week (around 08/22/2019) for ROB as scheduled.  Homero Fellers MD [facility], [facility] 08/15/2019, 11:20 AM

## 2019-08-24 ENCOUNTER — Other Ambulatory Visit: Payer: BC Managed Care – PPO

## 2019-08-24 ENCOUNTER — Encounter: Payer: Self-pay | Admitting: Obstetrics and Gynecology

## 2019-08-24 ENCOUNTER — Other Ambulatory Visit: Payer: Self-pay

## 2019-08-24 ENCOUNTER — Ambulatory Visit (INDEPENDENT_AMBULATORY_CARE_PROVIDER_SITE_OTHER): Payer: BC Managed Care – PPO | Admitting: Obstetrics and Gynecology

## 2019-08-24 ENCOUNTER — Ambulatory Visit (INDEPENDENT_AMBULATORY_CARE_PROVIDER_SITE_OTHER): Payer: BC Managed Care – PPO

## 2019-08-24 VITALS — BP 126/74 | Wt 174.0 lb

## 2019-08-24 DIAGNOSIS — O09893 Supervision of other high risk pregnancies, third trimester: Secondary | ICD-10-CM

## 2019-08-24 DIAGNOSIS — Z364 Encounter for antenatal screening for fetal growth retardation: Secondary | ICD-10-CM | POA: Diagnosis not present

## 2019-08-24 DIAGNOSIS — O10913 Unspecified pre-existing hypertension complicating pregnancy, third trimester: Secondary | ICD-10-CM

## 2019-08-24 DIAGNOSIS — Z3402 Encounter for supervision of normal first pregnancy, second trimester: Secondary | ICD-10-CM

## 2019-08-24 DIAGNOSIS — Z3A29 29 weeks gestation of pregnancy: Secondary | ICD-10-CM

## 2019-08-24 DIAGNOSIS — Z362 Encounter for other antenatal screening follow-up: Secondary | ICD-10-CM | POA: Diagnosis not present

## 2019-08-24 DIAGNOSIS — O10919 Unspecified pre-existing hypertension complicating pregnancy, unspecified trimester: Secondary | ICD-10-CM

## 2019-08-24 DIAGNOSIS — Z3403 Encounter for supervision of normal first pregnancy, third trimester: Secondary | ICD-10-CM

## 2019-08-24 DIAGNOSIS — O365930 Maternal care for other known or suspected poor fetal growth, third trimester, not applicable or unspecified: Secondary | ICD-10-CM

## 2019-08-24 NOTE — Progress Notes (Signed)
Routine Prenatal Care Visit  Subjective  Kiara Franklin is a 24 y.o. G1P0000 at [redacted]w[redacted]d being seen today for ongoing prenatal care.  She is currently monitored for the following issues for this high-risk pregnancy and has Chronic hypertension affecting pregnancy and Supervision of normal pregnancy on their problem list.  ----------------------------------------------------------------------------------- Patient reports no complaints.   Contractions: Not present. Vag. Bleeding: None.  Movement: Present. Leaking Fluid denies.  Growth u/s: 50th %ile, AFI nml ----------------------------------------------------------------------------------- The following portions of the patient's history were reviewed and updated as appropriate: allergies, current medications, past family history, past medical history, past social history, past surgical history and problem list. Problem list updated.  Objective  Blood pressure 126/74, weight 174 lb (78.9 kg), last menstrual period 01/25/2019. Pregravid weight 147 lb (66.7 kg) Total Weight Gain 27 lb (12.2 kg) Urinalysis: Urine Protein    Urine Glucose    Fetal Status: Fetal Heart Rate (bpm): 125   Movement: Present     General:  Alert, oriented and cooperative. Patient is in no acute distress.  Skin: Skin is warm and dry. No rash noted.   Cardiovascular: Normal heart rate noted  Respiratory: Normal respiratory effort, no problems with respiration noted  Abdomen: Soft, gravid, appropriate for gestational age. Pain/Pressure: Absent     Pelvic:  Cervical exam deferred        Extremities: Normal range of motion.     Mental Status: Normal mood and affect. Normal behavior. Normal judgment and thought content.   Imaging Results US OB Follow Up  Result Date: 08/24/2019 Patient Name: Eileene Kisling DOB: [birth year] MRN: [id number] ULTRASOUND REPORT Location: [facility] Date of Service: 08/24/2019 Indications:growth/afi Findings: [name] intrauterine pregnancy is  visualized with FHR at 126 BPM. Biometrics give an (U/S) Gestational age of [redacted]w[redacted]d and an (U/S) EDD of 11/09/2019; this correlates with the clinically established Estimated Date of Delivery: 11/09/19. Fetal presentation is Cephalic. Placenta: posterior. Grade: 1 AFI: 12.8 cm Growth percentile is 50.9%.  AC percentile is 28.1%. EFW: 1,308 g  (2 lb 14 oz) Impression: 1. [redacted]w[redacted]d Viable Singleton Intrauterine pregnancy previously established criteria. 2. Growth is 50.9 %ile.  AFI is 12.8 cm. Gweneth Dimitri, RT The ultrasound images and findings were reviewed by me and I agree with the above report. Prentice Docker, MD, [facility], [facility] 08/24/2019 2:31 PM      Assessment   24 y.o. G1P0000 at [redacted]w[redacted]d by  11/09/2019, by Ultrasound presenting for routine prenatal visit  Plan   Pregnancy#1 Problems (from 01/30/19 to present)    Problem Noted Resolved   Supervision of normal pregnancy 03/28/2019 by Will Bonnet, MD No   Overview Addendum 04/04/2019  1:24 PM by Homero Fellers, [place] Prenatal Labs  Dating  8 wk [place] Blood type: A/Positive/-- (10/28 1542)   Genetic Screen 1 Screen:     AFP:      Quad:      NIPS:    Antibody:Negative (10/28 1542)  Anatomic [place]  Rubella: <0.90 not immune Varicella: Immune  GTT Early:        28 wk:      RPR: Non Reactive (10/28 1542)   Rhogam  not needed HBsAg: Negative (10/28 1542)   TDaP vaccine                       HIV: Non Reactive (10/28 1542)   Flu Shot   03/28/2019  GBS:   Contraception  Pap: NIL  CBB     CS/VBAC    Baby Food    Support Person           Chronic hypertension affecting pregnancy 03/23/2019 by Particia Nearing, PA-C No   Overview Addendum 06/13/2019 10:55 AM by Conard Novak, MD    Blood pressures elevated in clinic. She checks her BPs daily at home and they have been normal. She has brought her home BP machine to clinic and the values are about  the same. Encouraged daily BP checks Start bASA after 12 weeks, just in case. Normal baseline labs.  Treat like cHTN [x]  Aspirin 81 mg daily after 12 weeks; discontinue after 36 weeks [x]  baseline labs with CBC, CMP, urine protein/creatinine ratio [ ]  no BP meds unless BPs become elevated [ ]  ultrasound for growth at 28, 32, 36 weeks [ ]  Baseline EKG   Current antihypertensives:  None   Baseline and surveillance labs (pulled in from [facility], refresh links as needed)  Lab Results  Component Value Date   PLT 396 03/28/2019   CREATININE 0.59 03/28/2019   AST 15 03/28/2019   ALT 10 03/28/2019    Antenatal Testing CHTN - O10.919  Group I  BP < 140/90, no preeclampsia, AGA,  nml AFV, +/- meds    Group II BP > 140/90, on meds, no preeclampsia, AGA, nml AFV  20-28-34-38  20-24-28-32-35-38  32//2 x wk  28//BPP wkly then 32//2 x wk  40 no meds; 39 meds  PRN or 37  Pre-eclampsia  GHTN - O13.9/Preeclampsia without severe features  - O14.00   Preeclampsia with severe features - O14.10  Q 3-4wks  Q 2 wks  28//BPP wkly then 32//2 x wk  Inpatient  37  PRN or 34           Preterm labor symptoms and general obstetric precautions including but not limited to vaginal bleeding, contractions, leaking of fluid and fetal movement were reviewed in detail with the patient. Please refer to After Visit Summary for other counseling recommendations.   Return in about 2 weeks (around 09/07/2019) for Routine Prenatal Appointment.  03/30/2019, MD, 08-28-2004 OB/GYN, [facility] 08/24/2019 2:41 PM

## 2019-08-25 LAB — 28 WEEK RH+PANEL
Basophils Absolute: 0 10*3/uL (ref 0.0–0.2)
Basos: 0 %
EOS (ABSOLUTE): 0.1 10*3/uL (ref 0.0–0.4)
Eos: 1 %
Gestational Diabetes Screen: 133 mg/dL (ref 65–139)
HIV Screen 4th Generation wRfx: NONREACTIVE
Hematocrit: 35.1 % (ref 34.0–46.6)
Hemoglobin: 12.3 g/dL (ref 11.1–15.9)
Immature Grans (Abs): 0.1 10*3/uL (ref 0.0–0.1)
Immature Granulocytes: 1 %
Lymphocytes Absolute: 2 10*3/uL (ref 0.7–3.1)
Lymphs: 16 %
MCH: 30.7 pg (ref 26.6–33.0)
MCHC: 35 g/dL (ref 31.5–35.7)
MCV: 88 fL (ref 79–97)
Monocytes Absolute: 0.9 10*3/uL (ref 0.1–0.9)
Monocytes: 7 %
Neutrophils Absolute: 9 10*3/uL — ABNORMAL HIGH (ref 1.4–7.0)
Neutrophils: 75 %
Platelets: 334 10*3/uL (ref 150–450)
RBC: 4.01 x10E6/uL (ref 3.77–5.28)
RDW: 12.4 % (ref 11.7–15.4)
RPR Ser Ql: NONREACTIVE
WBC: 12.1 10*3/uL — ABNORMAL HIGH (ref 3.4–10.8)

## 2019-09-10 ENCOUNTER — Other Ambulatory Visit: Payer: Self-pay

## 2019-09-10 ENCOUNTER — Ambulatory Visit (INDEPENDENT_AMBULATORY_CARE_PROVIDER_SITE_OTHER): Payer: BC Managed Care – PPO | Admitting: Obstetrics and Gynecology

## 2019-09-10 ENCOUNTER — Encounter: Payer: Self-pay | Admitting: Obstetrics and Gynecology

## 2019-09-10 VITALS — BP 122/74 | Wt 179.0 lb

## 2019-09-10 DIAGNOSIS — O10919 Unspecified pre-existing hypertension complicating pregnancy, unspecified trimester: Secondary | ICD-10-CM

## 2019-09-10 DIAGNOSIS — Z3A31 31 weeks gestation of pregnancy: Secondary | ICD-10-CM

## 2019-09-10 DIAGNOSIS — Z3403 Encounter for supervision of normal first pregnancy, third trimester: Secondary | ICD-10-CM

## 2019-09-10 NOTE — Progress Notes (Signed)
Routine Prenatal Care Visit  Subjective  Kiara Franklin is a 24 y.o. G1P0000 at [redacted]w[redacted]d being seen today for ongoing prenatal care.  She is currently monitored for the following issues for this high-risk pregnancy and has Chronic hypertension affecting pregnancy and Supervision of normal pregnancy on their problem list.  ----------------------------------------------------------------------------------- Patient reports no complaints.   Contractions: Not present. Vag. Bleeding: None.  Movement: Present. Leaking Fluid denies.  ----------------------------------------------------------------------------------- The following portions of the patient's history were reviewed and updated as appropriate: allergies, current medications, past family history, past medical history, past social history, past surgical history and problem list. Problem list updated.  Objective  Blood pressure 122/74, weight 179 lb (81.2 kg), last menstrual period 01/25/2019. Pregravid weight 147 lb (66.7 kg) Total Weight Gain 32 lb (14.5 kg) Urinalysis: Urine Protein    Urine Glucose    Fetal Status: Fetal Heart Rate (bpm): 130 Fundal Height: 31 cm Movement: Present     General:  Alert, oriented and cooperative. Patient is in no acute distress.  Skin: Skin is warm and dry. No rash noted.   Cardiovascular: Normal heart rate noted  Respiratory: Normal respiratory effort, no problems with respiration noted  Abdomen: Soft, gravid, appropriate for gestational age. Pain/Pressure: Absent     Pelvic:  Cervical exam deferred        Extremities: Normal range of motion.  Edema: None  Mental Status: Normal mood and affect. Normal behavior. Normal judgment and thought content.   Assessment   24 y.o. G1P0000 at [redacted]w[redacted]d by  11/09/2019, by Ultrasound presenting for routine prenatal visit  Plan   Pregnancy#1 Problems (from 01/30/19 to present)    Problem Noted Resolved   Supervision of normal pregnancy 03/28/2019 by Conard Novak, MD No   Overview Addendum 04/04/2019  1:24 PM by Natale Milch, MD      Clinic [facility] Prenatal Labs  Dating  8 wk [place] Blood type: A/Positive/-- (10/28 1542)   Genetic Screen 1 Screen:     AFP:      Quad:      NIPS:    Antibody:Negative (10/28 1542)  Anatomic [place]  Rubella: <0.90 not immune Varicella: Immune  GTT Early:        28 wk:      RPR: Non Reactive (10/28 1542)   Rhogam  not needed HBsAg: Negative (10/28 1542)   TDaP vaccine                       HIV: Non Reactive (10/28 1542)   Flu Shot   03/28/2019                             GBS:   Contraception  Pap: NIL  CBB     CS/VBAC    Baby Food    Support Person             Chronic hypertension affecting pregnancy 03/23/2019 by Particia Nearing, PA-C No   Overview Addendum 06/13/2019 10:55 AM by Conard Novak, MD    Blood pressures elevated in clinic. She checks her BPs daily at home and they have been normal. She has brought her home BP machine to clinic and the values are about the same. Encouraged daily BP checks Start bASA after 12 weeks, just in case. Normal baseline labs.  Treat like cHTN [x]  Aspirin 81 mg daily after 12 weeks; discontinue after 36 weeks [x]  baseline labs with  CBC, CMP, urine protein/creatinine ratio [ ]  no BP meds unless BPs become elevated [ ]  ultrasound for growth at 28, 32, 36 weeks [ ]  Baseline EKG   Current antihypertensives:  None   Baseline and surveillance labs (pulled in from [facility], refresh links as needed)  Lab Results  Component Value Date   PLT 396 03/28/2019   CREATININE 0.59 03/28/2019   AST 15 03/28/2019   ALT 10 03/28/2019    Antenatal Testing CHTN - O10.919  Group I  BP < 140/90, no preeclampsia, AGA,  nml AFV, +/- meds    Group II BP > 140/90, on meds, no preeclampsia, AGA, nml AFV  20-28-34-38  20-24-28-32-35-38  32//2 x wk  28//BPP wkly then 32//2 x wk  40 no meds; 39 meds  PRN or 37  Pre-eclampsia  GHTN - O13.9/Preeclampsia without  severe features  - O14.00   Preeclampsia with severe features - O14.10  Q 3-4wks  Q 2 wks  28//BPP wkly then 32//2 x wk  Inpatient  37  PRN or 34             Preterm labor symptoms and general obstetric precautions including but not limited to vaginal bleeding, contractions, leaking of fluid and fetal movement were reviewed in detail with the patient. Please refer to After Visit Summary for other counseling recommendations.   - TDaP today  Return in about 2 weeks (around 09/24/2019) for U/S for growth/afi and Routine Prenatal Appointment.  Prentice Docker, MD, [facility], [facility] 09/10/2019 4:02 PM

## 2019-09-28 ENCOUNTER — Ambulatory Visit (INDEPENDENT_AMBULATORY_CARE_PROVIDER_SITE_OTHER): Payer: BC Managed Care – PPO | Admitting: Obstetrics and Gynecology

## 2019-09-28 ENCOUNTER — Other Ambulatory Visit: Payer: Self-pay

## 2019-09-28 ENCOUNTER — Ambulatory Visit (INDEPENDENT_AMBULATORY_CARE_PROVIDER_SITE_OTHER): Payer: BC Managed Care – PPO

## 2019-09-28 ENCOUNTER — Encounter: Payer: Self-pay | Admitting: Obstetrics and Gynecology

## 2019-09-28 VITALS — BP 124/70 | Wt 179.0 lb

## 2019-09-28 DIAGNOSIS — Z3403 Encounter for supervision of normal first pregnancy, third trimester: Secondary | ICD-10-CM | POA: Diagnosis not present

## 2019-09-28 DIAGNOSIS — O10919 Unspecified pre-existing hypertension complicating pregnancy, unspecified trimester: Secondary | ICD-10-CM

## 2019-09-28 DIAGNOSIS — Z3A34 34 weeks gestation of pregnancy: Secondary | ICD-10-CM

## 2019-09-28 NOTE — Progress Notes (Signed)
Routine Prenatal Care Visit  Subjective  Kiara Franklin is a 24 y.o. G1P0000 at [redacted]w[redacted]d being seen today for ongoing prenatal care.  She is currently monitored for the following issues for this high-risk pregnancy and has Chronic hypertension affecting pregnancy and Supervision of normal pregnancy on their problem list.  ----------------------------------------------------------------------------------- Patient reports no complaints.   Contractions: Not present. Vag. Bleeding: None.  Movement: Present. Leaking Fluid denies.  Growth u/s: normal with normal fluid. Presentation is frank breech. ----------------------------------------------------------------------------------- The following portions of the patient's history were reviewed and updated as appropriate: allergies, current medications, past family history, past medical history, past social history, past surgical history and problem list. Problem list updated.  Objective  Blood pressure 124/70, weight 179 lb (81.2 kg), last menstrual period 01/25/2019. Pregravid weight 147 lb (66.7 kg) Total Weight Gain 32 lb (14.5 kg) Urinalysis: Urine Protein    Urine Glucose    Fetal Status: Fetal Heart Rate (bpm): 159   Movement: Present  Presentation: Homero Fellers Breech  General:  Alert, oriented and cooperative. Patient is in no acute distress.  Skin: Skin is warm and dry. No rash noted.   Cardiovascular: Normal heart rate noted  Respiratory: Normal respiratory effort, no problems with respiration noted  Abdomen: Soft, gravid, appropriate for gestational age. Pain/Pressure: Absent     Pelvic:  Cervical exam deferred        Extremities: Normal range of motion.  Edema: None  Mental Status: Normal mood and affect. Normal behavior. Normal judgment and thought content.   Imaging Results US OB Follow Up  Result Date: 09/28/2019 Patient Name: Saba Gomm DOB: 06/09/1995 MRN: [id number] ULTRASOUND REPORT Location: [facility] Date of Service:  09/28/2019 Indications:growth/afi Findings: Mason Jim intrauterine pregnancy is visualized with FHR at 159 BPM. Biometrics give an (U/S) Gestational age of [redacted]w[redacted]d and an (U/S) EDD of 11/13/2019; this correlates with the clinically established Estimated Date of Delivery: 11/09/19. Fetal presentation is [employer]. Placenta: posterior. Grade: 1 AFI: 15.3 cm Growth percentile is 31.5%.  AC percentile is 21.4%. EFW: 2,143 g (4 lb 12 oz)   Impression: 1. [redacted]w[redacted]d Viable Singleton Intrauterine pregnancy previously established criteria. 2. Growth is 31.5 %ile.  AFI is 15.3 cm. Deanna Artis, RT The ultrasound images and findings were reviewed by me and I agree with the above report. Thomasene Mohair, MD, [facility], [facility] 09/28/2019 10:33 AM       Assessment   24 y.o. G1P0000 at [redacted]w[redacted]d by  11/09/2019, by Ultrasound presenting for routine prenatal visit  Plan   Pregnancy#1 Problems (from 01/30/19 to present)    Problem Noted Resolved   Supervision of normal pregnancy 03/28/2019 by Conard Novak, MD No   Overview Addendum 04/04/2019  1:24 PM by Natale Milch, MD      Clinic [facility] Prenatal Labs  Dating  8 wk [place] Blood type: A/Positive/-- (10/28 1542)   Genetic Screen 1 Screen:     AFP:      Quad:      NIPS:    Antibody:Negative (10/28 1542)  Anatomic [place]  Rubella: <0.90 not immune Varicella: Immune  GTT Early:        28 wk:      RPR: Non Reactive (10/28 1542)   Rhogam  not needed HBsAg: Negative (10/28 1542)   TDaP vaccine                       HIV: Non Reactive (10/28 1542)   Flu  Shot   03/28/2019                             GBS:   Contraception  Pap: NIL  CBB     CS/VBAC    Baby Food    Support Person             Chronic hypertension affecting pregnancy 03/23/2019 by Volney American, PA-C No   Overview Addendum 06/13/2019 10:55 AM by Will Bonnet, MD    Blood pressures elevated in clinic. She checks her BPs daily at home and they  have been normal. She has brought her home BP machine to clinic and the values are about the same. Encouraged daily BP checks Start bASA after 12 weeks, just in case. Normal baseline labs.  Treat like cHTN [x]  Aspirin 81 mg daily after 12 weeks; discontinue after 36 weeks [x]  baseline labs with CBC, CMP, urine protein/creatinine ratio [ ]  no BP meds unless BPs become elevated [ ]  ultrasound for growth at 28, 32, 36 weeks [ ]  Baseline EKG   Current antihypertensives:  None   Baseline and surveillance labs (pulled in from [facility], refresh links as needed)  Lab Results  Component Value Date   PLT 396 03/28/2019   CREATININE 0.59 03/28/2019   AST 15 03/28/2019   ALT 10 03/28/2019    Antenatal Testing CHTN - O10.919  Group I  BP < 140/90, no preeclampsia, AGA,  nml AFV, +/- meds    Group II BP > 140/90, on meds, no preeclampsia, AGA, nml AFV  20-28-34-38  20-24-28-32-35-38  32//2 x wk  28//BPP wkly then 32//2 x wk  40 no meds; 39 meds  PRN or 37  Pre-eclampsia  GHTN - O13.9/Preeclampsia without severe features  - O14.00   Preeclampsia with severe features - O14.10  Q 3-4wks  Q 2 wks  28//BPP wkly then 32//2 x wk  Inpatient  37  PRN or 34             Preterm labor symptoms and general obstetric precautions including but not limited to vaginal bleeding, contractions, leaking of fluid and fetal movement were reviewed in detail with the patient. Please refer to After Visit Summary for other counseling recommendations.   Return in about 1 week (around 10/05/2019) for ultrasound for AFI with ROB/NST.  Prentice Docker, MD, [facility], [facility] 09/28/2019 10:41 AM

## 2019-10-02 ENCOUNTER — Other Ambulatory Visit: Payer: Self-pay

## 2019-10-02 ENCOUNTER — Encounter: Payer: Self-pay | Admitting: Obstetrics

## 2019-10-02 ENCOUNTER — Ambulatory Visit (INDEPENDENT_AMBULATORY_CARE_PROVIDER_SITE_OTHER): Payer: BC Managed Care – PPO | Admitting: Obstetrics

## 2019-10-02 VITALS — BP 122/74 | Wt 178.0 lb

## 2019-10-02 DIAGNOSIS — O4693 Antepartum hemorrhage, unspecified, third trimester: Secondary | ICD-10-CM

## 2019-10-02 DIAGNOSIS — Z3A34 34 weeks gestation of pregnancy: Secondary | ICD-10-CM

## 2019-10-02 DIAGNOSIS — Z3403 Encounter for supervision of normal first pregnancy, third trimester: Secondary | ICD-10-CM

## 2019-10-02 NOTE — Progress Notes (Signed)
Work in [employer] for vaginal bleeding this AM. Last time she had IC was over the weekend. No pain. Still feeling movement. Blood was bright red when she wiped.

## 2019-10-02 NOTE — Progress Notes (Signed)
Work in appointment at 44 w4 d for evaluation of vaginal spotting. Kiara Franklin noticed scant red spotting thismorning at work after using the bathroom. Two episodes this AM; she denies any cramping, or recent intercourse.  Her baby [name]) is moving often. She denies any danger sxs- no headache, visual changes. Used the bathroom at the office and did not see any blood.  O: see VS above      Speculum exam reveals + leukkorhea; no vaginal bleeding seen       Cervix appears closed       [place]: suspect baby has "turned" to vertex. A: IUP 34 + weeks     Vaginal spotting, resolved P: to monitor for any other bleeding. FKCs daily.      ROB in 3 days for next ROB  Mirna Mires, CNM

## 2019-10-05 ENCOUNTER — Other Ambulatory Visit (INDEPENDENT_AMBULATORY_CARE_PROVIDER_SITE_OTHER): Payer: BC Managed Care – PPO

## 2019-10-05 ENCOUNTER — Ambulatory Visit (INDEPENDENT_AMBULATORY_CARE_PROVIDER_SITE_OTHER): Payer: BC Managed Care – PPO | Admitting: Obstetrics and Gynecology

## 2019-10-05 ENCOUNTER — Other Ambulatory Visit: Payer: Self-pay | Admitting: Obstetrics and Gynecology

## 2019-10-05 ENCOUNTER — Encounter: Payer: Self-pay | Admitting: Obstetrics and Gynecology

## 2019-10-05 ENCOUNTER — Other Ambulatory Visit: Payer: Self-pay

## 2019-10-05 VITALS — BP 126/74 | Wt 177.0 lb

## 2019-10-05 DIAGNOSIS — Z3A35 35 weeks gestation of pregnancy: Secondary | ICD-10-CM

## 2019-10-05 DIAGNOSIS — O10919 Unspecified pre-existing hypertension complicating pregnancy, unspecified trimester: Secondary | ICD-10-CM | POA: Diagnosis not present

## 2019-10-05 DIAGNOSIS — Z3689 Encounter for other specified antenatal screening: Secondary | ICD-10-CM | POA: Diagnosis not present

## 2019-10-05 DIAGNOSIS — O0993 Supervision of high risk pregnancy, unspecified, third trimester: Secondary | ICD-10-CM | POA: Diagnosis not present

## 2019-10-05 NOTE — Progress Notes (Signed)
Routine Prenatal Care Visit  Subjective  Kiara Franklin is a 24 y.o. G1P0000 at [redacted]w[redacted]d being seen today for ongoing prenatal care.  She is currently monitored for the following issues for this high-risk pregnancy and has Chronic hypertension affecting pregnancy and Supervision of high risk pregnancy, antepartum on their problem list.  ----------------------------------------------------------------------------------- Patient reports no complaints.   Contractions: Not present. Vag. Bleeding: None.  Movement: Present. Leaking Fluid denies.  U/S shows AFi 13 cm, cephalic presentation  ----------------------------------------------------------------------------------- The following portions of the patient's history were reviewed and updated as appropriate: allergies, current medications, past family history, past medical history, past social history, past surgical history and problem list. Problem list updated.  Objective  Blood pressure 126/74, weight 177 lb (80.3 kg), last menstrual period 01/25/2019. Pregravid weight 147 lb (66.7 kg) Total Weight Gain 30 lb (13.6 kg) Urinalysis: Urine Protein    Urine Glucose    Fetal Status: Fetal Heart Rate (bpm): 135   Movement: Present  Presentation: Vertex  General:  Alert, oriented and cooperative. Patient is in no acute distress.  Skin: Skin is warm and dry. No rash noted.   Cardiovascular: Normal heart rate noted  Respiratory: Normal respiratory effort, no problems with respiration noted  Abdomen: Soft, gravid, appropriate for gestational age. Pain/Pressure: Absent     Pelvic:  Cervical exam deferred        Extremities: Normal range of motion.  Edema: None  Mental Status: Normal mood and affect. Normal behavior. Normal judgment and thought content.   Imaging Results US OB Limited  Result Date: 10/05/2019 Patient Name: Kiara Franklin DOB: [birth year] MRN: [id number] ULTRASOUND REPORT Location: [facility] Date of Service: 10/05/2019 Indications:AFI  Findings: Kiara Franklin intrauterine pregnancy is visualized with FHR at 149 BPM. Fetal presentation is Cephalic. Placenta: posterior. Grade: 1 AFI: 13.0 cm Impression: 1. [redacted]w[redacted]d Viable Singleton Intrauterine pregnancy dated by previously established criteria. 2. AFI is 13.0 cm. Gweneth Dimitri, RT The ultrasound images and findings were reviewed by me and I agree with the above report. Prentice Docker, MD, [facility], [facility] 10/05/2019 8:43 AM       Assessment   24 y.o. G1P0000 at [redacted]w[redacted]d by  11/09/2019, by Ultrasound presenting for routine prenatal visit  Plan   Pregnancy#1 Problems (from 01/30/19 to present)    Problem Noted Resolved   Supervision of high risk pregnancy, antepartum 03/28/2019 by Will Bonnet, MD No   Overview Addendum 04/04/2019  1:24 PM by Homero Fellers, [place] Prenatal Labs  Dating  8 wk [place] Blood type: A/Positive/-- (10/28 1542)   Genetic Screen 1 Screen:     AFP:      Quad:      NIPS:    Antibody:Negative (10/28 1542)  Anatomic [place]  Rubella: <0.90 not immune Varicella: Immune  GTT Early:        28 wk:      RPR: Non Reactive (10/28 1542)   Rhogam  not needed HBsAg: Negative (10/28 1542)   TDaP vaccine                       HIV: Non Reactive (10/28 1542)   Flu Shot   03/28/2019                             GBS:   Contraception  Pap: NIL  CBB     CS/VBAC  Baby Food    Support Person             Chronic hypertension affecting pregnancy 03/23/2019 by Particia Nearing, PA-C No   Overview Addendum 06/13/2019 10:55 AM by Conard Novak, MD    Blood pressures elevated in clinic. She checks her BPs daily at home and they have been normal. She has brought her home BP machine to clinic and the values are about the same. Encouraged daily BP checks Start bASA after 12 weeks, just in case. Normal baseline labs.  Treat like cHTN [x]  Aspirin 81 mg daily after 12 weeks; discontinue after 36 weeks [x]   baseline labs with CBC, CMP, urine protein/creatinine ratio [ ]  no BP meds unless BPs become elevated [ ]  ultrasound for growth at 28, 32, 36 weeks [ ]  Baseline EKG   Current antihypertensives:  None   Baseline and surveillance labs (pulled in from [facility], refresh links as needed)  Lab Results  Component Value Date   PLT 396 03/28/2019   CREATININE 0.59 03/28/2019   AST 15 03/28/2019   ALT 10 03/28/2019    Antenatal Testing CHTN - O10.919  Group I  BP < 140/90, no preeclampsia, AGA,  nml AFV, +/- meds    Group II BP > 140/90, on meds, no preeclampsia, AGA, nml AFV  20-28-34-38  20-24-28-32-35-38  32//2 x wk  28//BPP wkly then 32//2 x wk  40 no meds; 39 meds  PRN or 37  Pre-eclampsia  GHTN - O13.9/Preeclampsia without severe features  - O14.00   Preeclampsia with severe features - O14.10  Q 3-4wks  Q 2 wks  28//BPP wkly then 32//2 x wk  Inpatient  37  PRN or 34             Preterm labor symptoms and general obstetric precautions including but not limited to vaginal bleeding, contractions, leaking of fluid and fetal movement were reviewed in detail with the patient. Please refer to After Visit Summary for other counseling recommendations.   Return in about 1 week (around 10/12/2019) for U/S for AFI with ROB/NST with SDJ when possible.  03/30/2019, MD, 08-28-2004 OB/GYN, [facility] 10/05/2019 8:56 AM

## 2019-10-12 ENCOUNTER — Encounter: Payer: Self-pay | Admitting: Obstetrics and Gynecology

## 2019-10-12 ENCOUNTER — Other Ambulatory Visit (HOSPITAL_COMMUNITY)
Admission: RE | Admit: 2019-10-12 | Discharge: 2019-10-12 | Disposition: A | Discharge status: Home / Self Care | Payer: BC Managed Care – PPO | Source: Ambulatory Visit | Attending: Obstetrics and Gynecology | Admitting: Obstetrics and Gynecology

## 2019-10-12 ENCOUNTER — Ambulatory Visit (INDEPENDENT_AMBULATORY_CARE_PROVIDER_SITE_OTHER): Payer: BC Managed Care – PPO | Admitting: Obstetrics and Gynecology

## 2019-10-12 ENCOUNTER — Ambulatory Visit (INDEPENDENT_AMBULATORY_CARE_PROVIDER_SITE_OTHER): Payer: BC Managed Care – PPO

## 2019-10-12 ENCOUNTER — Other Ambulatory Visit: Payer: Self-pay

## 2019-10-12 VITALS — BP 128/80 | Wt 180.0 lb

## 2019-10-12 DIAGNOSIS — Z3A36 36 weeks gestation of pregnancy: Secondary | ICD-10-CM | POA: Insufficient documentation

## 2019-10-12 DIAGNOSIS — O10919 Unspecified pre-existing hypertension complicating pregnancy, unspecified trimester: Secondary | ICD-10-CM

## 2019-10-12 DIAGNOSIS — O0993 Supervision of high risk pregnancy, unspecified, third trimester: Secondary | ICD-10-CM | POA: Diagnosis not present

## 2019-10-12 NOTE — Progress Notes (Signed)
Routine Prenatal Care Visit  Subjective  Kiara Franklin is a 24 y.o. G1P0000 at [redacted]w[redacted]d being seen today for ongoing prenatal care.  She is currently monitored for the following issues for this high-risk pregnancy and has Chronic hypertension affecting pregnancy and Supervision of high risk pregnancy, antepartum on their problem list.  ----------------------------------------------------------------------------------- Patient reports no complaints.   Contractions: Not present. Vag. Bleeding: None.  Movement: Present. Leaking Fluid denies.  U/S: AFI 14 cm today, presentation cephalic ----------------------------------------------------------------------------------- The following portions of the patient's history were reviewed and updated as appropriate: allergies, current medications, past family history, past medical history, past social history, past surgical history and problem list. Problem list updated.  Objective  Blood pressure 128/80, weight 180 lb (81.6 kg), last menstrual period 01/25/2019. Pregravid weight 147 lb (66.7 kg) Total Weight Gain 33 lb (15 kg) Urinalysis: Urine Protein    Urine Glucose    Fetal Status: Fetal Heart Rate (bpm): 125   Movement: Present  Presentation: Vertex  General:  Alert, oriented and cooperative. Patient is in no acute distress.  Skin: Skin is warm and dry. No rash noted.   Cardiovascular: Normal heart rate noted  Respiratory: Normal respiratory effort, no problems with respiration noted  Abdomen: Soft, gravid, appropriate for gestational age. Pain/Pressure: Absent     Pelvic:  Cervical exam deferred        Extremities: Normal range of motion.  Edema: None  Mental Status: Normal mood and affect. Normal behavior. Normal judgment and thought content.   NST: Baseline FHR: 125 beats/min Variability: moderate Accelerations: present Decelerations: absent Tocometry: not done  Interpretation:  INDICATIONS: chronic hypertension RESULTS:  A NST  procedure was performed with FHR monitoring and a normal baseline established, appropriate time of 20-40 minutes of evaluation, and accels >2 seen w 15x15 characteristics.  Results show a REACTIVE NST.    Imaging Results US OB Limited  Result Date: 10/12/2019 Patient Name: Kiara Franklin DOB: [birth year] MRN: [id number] ULTRASOUND REPORT Location: [facility] Date of Service: 10/12/2019 Indications:AFI Findings: Mason Jim intrauterine pregnancy is visualized with FHR at 126 BPM. Fetal presentation is Cephalic. Placenta: posterior. Grade: 2 AFI: 14.3 cm Impression: 1. [redacted]w[redacted]d Viable Singleton Intrauterine pregnancy dated by previously established criteria. 2. AFI is 14.3 cm. Deanna Artis, RT The ultrasound images and findings were reviewed by me and I agree with the above report. Thomasene Mohair, MD, [facility], [facility] 10/12/2019 9:15 AM       Assessment   24 y.o. G1P0000 at [redacted]w[redacted]d by  11/09/2019, by Ultrasound presenting for routine prenatal visit  Plan   Pregnancy#1 Problems (from 01/30/19 to present)    Problem Noted Resolved   Supervision of high risk pregnancy, antepartum 03/28/2019 by Conard Novak, MD No   Overview Addendum 04/04/2019  1:24 PM by Natale Milch, MD      Clinic [facility] Prenatal Labs  Dating  8 wk [place] Blood type: A/Positive/-- (10/28 1542)   Genetic Screen 1 Screen:     AFP:      Quad:      NIPS:    Antibody:Negative (10/28 1542)  Anatomic [place]  Rubella: <0.90 not immune Varicella: Immune  GTT Early:        28 wk:      RPR: Non Reactive (10/28 1542)   Rhogam  not needed HBsAg: Negative (10/28 1542)   TDaP vaccine  HIV: Non Reactive (10/28 1542)   Flu Shot   03/28/2019                             GBS:   Contraception  Pap: NIL  CBB     CS/VBAC    Baby Food    Support Person             Chronic hypertension affecting pregnancy 03/23/2019 by Volney American, PA-C No   Overview Addendum  06/13/2019 10:55 AM by Will Bonnet, MD    Blood pressures elevated in clinic. She checks her BPs daily at home and they have been normal. She has brought her home BP machine to clinic and the values are about the same. Encouraged daily BP checks Start bASA after 12 weeks, just in case. Normal baseline labs.  Treat like cHTN [x]  Aspirin 81 mg daily after 12 weeks; discontinue after 36 weeks [x]  baseline labs with CBC, CMP, urine protein/creatinine ratio [ ]  no BP meds unless BPs become elevated [ ]  ultrasound for growth at 28, 32, 36 weeks [ ]  Baseline EKG   Current antihypertensives:  None   Baseline and surveillance labs (pulled in from [facility], refresh links as needed)  Lab Results  Component Value Date   PLT 396 03/28/2019   CREATININE 0.59 03/28/2019   AST 15 03/28/2019   ALT 10 03/28/2019    Antenatal Testing CHTN - O10.919  Group I  BP < 140/90, no preeclampsia, AGA,  nml AFV, +/- meds    Group II BP > 140/90, on meds, no preeclampsia, AGA, nml AFV  20-28-34-38  20-24-28-32-35-38  32//2 x wk  28//BPP wkly then 32//2 x wk  40 no meds; 39 meds  PRN or 37  Pre-eclampsia  GHTN - O13.9/Preeclampsia without severe features  - O14.00   Preeclampsia with severe features - O14.10  Q 3-4wks  Q 2 wks  28//BPP wkly then 32//2 x wk  Inpatient  37  PRN or 34             Preterm labor symptoms and general obstetric precautions including but not limited to vaginal bleeding, contractions, leaking of fluid and fetal movement were reviewed in detail with the patient. Please refer to After Visit Summary for other counseling recommendations.   - AFI normal - NST reactive - GBS/Aptima today - likely IOL ~39 weeks, if BPs remain normal.   Return in about 1 week (around 10/19/2019) for u/s for AFI and Routine Prenatal Appointment with NST w SDJ.  Prentice Docker, MD, [facility], [facility] 10/12/2019 9:30 AM

## 2019-10-14 LAB — STREP GP B NAA: Strep Gp B NAA: NEGATIVE

## 2019-10-16 LAB — CERVICOVAGINAL ANCILLARY ONLY
Chlamydia: NEGATIVE
Comment: NEGATIVE
Comment: NORMAL
Neisseria Gonorrhea: NEGATIVE

## 2019-10-19 ENCOUNTER — Encounter: Payer: Self-pay | Admitting: Obstetrics and Gynecology

## 2019-10-19 ENCOUNTER — Other Ambulatory Visit: Payer: Self-pay

## 2019-10-19 ENCOUNTER — Ambulatory Visit (INDEPENDENT_AMBULATORY_CARE_PROVIDER_SITE_OTHER): Payer: BC Managed Care – PPO

## 2019-10-19 ENCOUNTER — Ambulatory Visit (INDEPENDENT_AMBULATORY_CARE_PROVIDER_SITE_OTHER): Payer: BC Managed Care – PPO | Admitting: Obstetrics and Gynecology

## 2019-10-19 VITALS — BP 128/84 | Wt 185.0 lb

## 2019-10-19 DIAGNOSIS — O0993 Supervision of high risk pregnancy, unspecified, third trimester: Secondary | ICD-10-CM

## 2019-10-19 DIAGNOSIS — Z3A37 37 weeks gestation of pregnancy: Secondary | ICD-10-CM | POA: Diagnosis not present

## 2019-10-19 DIAGNOSIS — O10919 Unspecified pre-existing hypertension complicating pregnancy, unspecified trimester: Secondary | ICD-10-CM | POA: Diagnosis not present

## 2019-10-19 NOTE — Progress Notes (Signed)
Routine Prenatal Care Visit  Subjective  Kiara Franklin is a 24 y.o. G1P0000 at [redacted]w[redacted]d being seen today for ongoing prenatal care.  She is currently monitored for the following issues for this high-risk pregnancy and has Chronic hypertension affecting pregnancy and Supervision of high risk pregnancy, antepartum on their problem list.  ----------------------------------------------------------------------------------- Patient reports no complaints.   Contractions: Not present. Vag. Bleeding: None.  Movement: Present. Leaking Fluid denies.  U/S shows normal AFI --------------------------------------------------------------------------------- The following portions of the patient's history were reviewed and updated as appropriate: allergies, current medications, past family history, past medical history, past social history, past surgical history and problem list. Problem list updated.  Objective  Blood pressure 128/84, weight 185 lb (83.9 kg), last menstrual period 01/25/2019. Pregravid weight 147 lb (66.7 kg) Total Weight Gain 38 lb (17.2 kg) Urinalysis: Urine Protein    Urine Glucose    Fetal Status: Fetal Heart Rate (bpm): 125   Movement: Present  Presentation: Vertex  General:  Alert, oriented and cooperative. Patient is in no acute distress.  Skin: Skin is warm and dry. No rash noted.   Cardiovascular: Normal heart rate noted  Respiratory: Normal respiratory effort, no problems with respiration noted  Abdomen: Soft, gravid, appropriate for gestational age. Pain/Pressure: Absent     Pelvic:  Cervical exam deferred        Extremities: Normal range of motion.  Edema: None  Mental Status: Normal mood and affect. Normal behavior. Normal judgment and thought content.   NST: Baseline FHR: 125 beats/min Variability: moderate Accelerations: present Decelerations: absent Tocometry: not done  Interpretation:  INDICATIONS: chronic hypertension RESULTS:  A NST procedure was performed with  FHR monitoring and a normal baseline established, appropriate time of 20-40 minutes of evaluation, and accels >2 seen w 15x15 characteristics.  Results show a REACTIVE NST.    Imaging Results US OB Limited  Result Date: 10/19/2019 Patient Name: Kiara Franklin DOB: [birth year] MRN: [id number] ULTRASOUND REPORT Location: [facility] Date of Service: 10/19/2019 Indications:AFI Findings: Mason Jim intrauterine pregnancy is visualized with FHR at 144 BPM. Fetal presentation is Cephalic. Placenta: posterior. Grade: 1 AFI: 14.0 cm Impression: 1. [redacted]w[redacted]d Viable Singleton Intrauterine pregnancy dated by previously established criteria. 2. AFI is 14.0 cm. Deanna Artis, RT The ultrasound images and findings were reviewed by me and I agree with the above report. Thomasene Mohair, MD, [facility], [facility] 10/19/2019 8:40 AM      Assessment   24 y.o. G1P0000 at [redacted]w[redacted]d by  11/09/2019, by Ultrasound presenting for routine prenatal visit  Plan   Pregnancy#1 Problems (from 01/30/19 to present)    Problem Noted Resolved   Supervision of high risk pregnancy, antepartum 03/28/2019 by Conard Novak, MD No   Overview Addendum 04/04/2019  1:24 PM by Natale Milch, MD      Clinic [facility] Prenatal Labs  Dating  8 wk [place] Blood type: A/Positive/-- (10/28 1542)   Genetic Screen 1 Screen:     AFP:      Quad:      NIPS:    Antibody:Negative (10/28 1542)  Anatomic [place]  Rubella: <0.90 not immune Varicella: Immune  GTT Early:        28 wk:      RPR: Non Reactive (10/28 1542)   Rhogam  not needed HBsAg: Negative (10/28 1542)   TDaP vaccine                       HIV: Non  Reactive (10/28 1542)   Flu Shot   03/28/2019                             GBS:   Contraception  Pap: NIL  CBB     CS/VBAC    Baby Food    Support Person             Chronic hypertension affecting pregnancy 03/23/2019 by Volney American, PA-C No   Overview Addendum 06/13/2019 10:55 AM by Will Bonnet, MD    Blood pressures elevated in clinic. She checks her BPs daily at home and they have been normal. She has brought her home BP machine to clinic and the values are about the same. Encouraged daily BP checks Start bASA after 12 weeks, just in case. Normal baseline labs.  Treat like cHTN [x]  Aspirin 81 mg daily after 12 weeks; discontinue after 36 weeks [x]  baseline labs with CBC, CMP, urine protein/creatinine ratio [ ]  no BP meds unless BPs become elevated [ ]  ultrasound for growth at 28, 32, 36 weeks [ ]  Baseline EKG   Current antihypertensives:  None   Baseline and surveillance labs (pulled in from [facility], refresh links as needed)  Lab Results  Component Value Date   PLT 396 03/28/2019   CREATININE 0.59 03/28/2019   AST 15 03/28/2019   ALT 10 03/28/2019    Antenatal Testing CHTN - O10.919  Group I  BP < 140/90, no preeclampsia, AGA,  nml AFV, +/- meds    Group II BP > 140/90, on meds, no preeclampsia, AGA, nml AFV  20-28-34-38  20-24-28-32-35-38  32//2 x wk  28//BPP wkly then 32//2 x wk  40 no meds; 39 meds  PRN or 37  Pre-eclampsia  GHTN - O13.9/Preeclampsia without severe features  - O14.00   Preeclampsia with severe features - O14.10  Q 3-4wks  Q 2 wks  28//BPP wkly then 32//2 x wk  Inpatient  37  PRN or 34             Term labor symptoms and general obstetric precautions including but not limited to vaginal bleeding, contractions, leaking of fluid and fetal movement were reviewed in detail with the patient. Please refer to After Visit Summary for other counseling recommendations.   Return in about 1 week (around 10/26/2019) for Growth u/s and ROB/NST with Dr Glennon Mac (may force on schedule).  Prentice Docker, MD, [facility], [facility] 10/19/2019 8:59 AM

## 2019-10-24 ENCOUNTER — Other Ambulatory Visit: Payer: Self-pay

## 2019-10-24 ENCOUNTER — Ambulatory Visit (INDEPENDENT_AMBULATORY_CARE_PROVIDER_SITE_OTHER): Payer: BC Managed Care – PPO | Admitting: Obstetrics and Gynecology

## 2019-10-24 ENCOUNTER — Ambulatory Visit (INDEPENDENT_AMBULATORY_CARE_PROVIDER_SITE_OTHER): Payer: BC Managed Care – PPO

## 2019-10-24 ENCOUNTER — Encounter: Payer: Self-pay | Admitting: Obstetrics and Gynecology

## 2019-10-24 VITALS — BP 126/74 | Wt 186.0 lb

## 2019-10-24 DIAGNOSIS — O0993 Supervision of high risk pregnancy, unspecified, third trimester: Secondary | ICD-10-CM | POA: Diagnosis not present

## 2019-10-24 DIAGNOSIS — O10913 Unspecified pre-existing hypertension complicating pregnancy, third trimester: Secondary | ICD-10-CM | POA: Diagnosis not present

## 2019-10-24 DIAGNOSIS — O10919 Unspecified pre-existing hypertension complicating pregnancy, unspecified trimester: Secondary | ICD-10-CM

## 2019-10-24 DIAGNOSIS — Z3A37 37 weeks gestation of pregnancy: Secondary | ICD-10-CM | POA: Diagnosis not present

## 2019-10-24 DIAGNOSIS — O0990 Supervision of high risk pregnancy, unspecified, unspecified trimester: Secondary | ICD-10-CM

## 2019-10-24 DIAGNOSIS — Z3A36 36 weeks gestation of pregnancy: Secondary | ICD-10-CM | POA: Diagnosis not present

## 2019-10-24 NOTE — Progress Notes (Signed)
Routine Prenatal Care Visit  Subjective  Kiara Franklin is a 24 y.o. G1P0000 at [redacted]w[redacted]d being seen today for ongoing prenatal care.  She is currently monitored for the following issues for this high-risk pregnancy and has Chronic hypertension affecting pregnancy and Supervision of high risk pregnancy, antepartum on their problem list.  ----------------------------------------------------------------------------------- Patient reports no complaints.   Contractions: Not present. Vag. Bleeding: None.  Movement: Present. Leaking Fluid denies.  Growth u/s normal today with normal AFI ----------------------------------------------------------------------------------- The following portions of the patient's history were reviewed and updated as appropriate: allergies, current medications, past family history, past medical history, past social history, past surgical history and problem list. Problem list updated.  Objective  Blood pressure 126/74, weight 186 lb (84.4 kg), last menstrual period 01/25/2019. Pregravid weight 147 lb (66.7 kg) Total Weight Gain 39 lb (17.7 kg) Urinalysis: Urine Protein    Urine Glucose    Fetal Status: Fetal Heart Rate (bpm): 130   Movement: Present  Presentation: Vertex  General:  Alert, oriented and cooperative. Patient is in no acute distress.  Skin: Skin is warm and dry. No rash noted.   Cardiovascular: Normal heart rate noted  Respiratory: Normal respiratory effort, no problems with respiration noted  Abdomen: Soft, gravid, appropriate for gestational age. Pain/Pressure: Absent     Pelvic:  Cervical exam deferred        Extremities: Normal range of motion.  Edema: None  Mental Status: Normal mood and affect. Normal behavior. Normal judgment and thought content.   Imaging Results US OB Follow Up  Result Date: 10/24/2019 Patient Name: Kiara Franklin DOB: 1996-03-01 MRN: [id number] ULTRASOUND REPORT Location: [facility] Date of Service: 10/24/2019  Indications:growth/afi Findings: Kiara Franklin intrauterine pregnancy is visualized with FHR at 136 BPM. Biometrics give an (U/S) Gestational age of [redacted]w[redacted]d and an (U/S) EDD of 11/15/2019; this correlates with the clinically established Estimated Date of Delivery: 11/09/19. Fetal presentation is Cephalic. Placenta: posterior. Grade: 2 AFI: 14.2 cm Growth percentile is 36.8%.  AC percentile is 31.5%. EFW: 3,022 g  (6 lb 11 oz) Impression: 1. [redacted]w[redacted]d Viable Singleton Intrauterine pregnancy previously established criteria. 2. Growth is 36.8 %ile.  AFI is 14.2 cm. Kiara Franklin, RT The ultrasound images and findings were reviewed by me and I agree with the above report. Prentice Docker, MD, [facility], [facility] 10/24/2019 10:31 AM       NST: Baseline FHR: 130 beats/min Variability: moderate Accelerations: present Decelerations: absent Tocometry: not done  Interpretation:  INDICATIONS: chronic hypertension RESULTS:  A NST procedure was performed with FHR monitoring and a normal baseline established, appropriate time of 20-40 minutes of evaluation, and accels >2 seen w 15x15 characteristics.  Results show a REACTIVE NST.    Assessment   24 y.o. G1P0000 at [redacted]w[redacted]d by  11/09/2019, by Ultrasound presenting for routine prenatal visit  Plan   Pregnancy#1 Problems (from 01/30/19 to present)    Problem Noted Resolved   Supervision of high risk pregnancy, antepartum 03/28/2019 by Will Bonnet, MD No   Overview Addendum 04/04/2019  1:24 PM by Homero Fellers, [facility] Prenatal Labs  Dating  8 wk [place] Blood type: A/Positive/-- (10/28 1542)   Genetic Screen 1 Screen:     AFP:      Quad:      NIPS:    Antibody:Negative (10/28 1542)  Anatomic [place]  Rubella: <0.90 not immune Varicella: Immune  GTT Early:  28 wk:      RPR: Non Reactive (10/28 1542)   Rhogam  not needed HBsAg: Negative (10/28 1542)   TDaP vaccine                       HIV: Non Reactive  (10/28 1542)   Flu Shot   03/28/2019                             GBS:   Contraception  Pap: NIL  CBB     CS/VBAC    Baby Food    Support Person             Chronic hypertension affecting pregnancy 03/23/2019 by Particia Nearing, PA-C No   Overview Addendum 06/13/2019 10:55 AM by Conard Novak, MD    Blood pressures elevated in clinic. She checks her BPs daily at home and they have been normal. She has brought her home BP machine to clinic and the values are about the same. Encouraged daily BP checks Start bASA after 12 weeks, just in case. Normal baseline labs.  Treat like cHTN [x]  Aspirin 81 mg daily after 12 weeks; discontinue after 36 weeks [x]  baseline labs with CBC, CMP, urine protein/creatinine ratio [ ]  no BP meds unless BPs become elevated [ ]  ultrasound for growth at 28, 32, 36 weeks [ ]  Baseline EKG   Current antihypertensives:  None   Baseline and surveillance labs (pulled in from [facility], refresh links as needed)  Lab Results  Component Value Date   PLT 396 03/28/2019   CREATININE 0.59 03/28/2019   AST 15 03/28/2019   ALT 10 03/28/2019    Antenatal Testing CHTN - O10.919  Group I  BP < 140/90, no preeclampsia, AGA,  nml AFV, +/- meds    Group II BP > 140/90, on meds, no preeclampsia, AGA, nml AFV  20-28-34-38  20-24-28-32-35-38  32//2 x wk  28//BPP wkly then 32//2 x wk  40 no meds; 39 meds  PRN or 37  Pre-eclampsia  GHTN - O13.9/Preeclampsia without severe features  - O14.00   Preeclampsia with severe features - O14.10  Q 3-4wks  Q 2 wks  28//BPP wkly then 32//2 x wk  Inpatient  37  PRN or 34             Term labor symptoms and general obstetric precautions including but not limited to vaginal bleeding, contractions, leaking of fluid and fetal movement were reviewed in detail with the patient. Please refer to After Visit Summary for other counseling recommendations.   - Reassuring APT today. - Discussed timing of  induction. She had elevated BPs at the beginning of the pregnancy. Normal up to this point since then. Discussed considering IOL by EDD, if no increase in BPs.   Return in about 1 week (around 10/31/2019) for U/S for AFi with ROB/NST after.  03/30/2019, MD, 08-28-2004 OB/GYN, [facility] 10/24/2019 10:46 AM

## 2019-11-02 ENCOUNTER — Encounter: Payer: Self-pay | Admitting: Obstetrics & Gynecology

## 2019-11-02 ENCOUNTER — Ambulatory Visit (INDEPENDENT_AMBULATORY_CARE_PROVIDER_SITE_OTHER): Payer: BC Managed Care – PPO | Admitting: Obstetrics

## 2019-11-02 ENCOUNTER — Ambulatory Visit (INDEPENDENT_AMBULATORY_CARE_PROVIDER_SITE_OTHER): Payer: BC Managed Care – PPO

## 2019-11-02 ENCOUNTER — Inpatient Hospital Stay
Admission: EM | Admit: 2019-11-02 | Discharge: 2019-11-04 | DRG: 807 | Disposition: A | Discharge status: Home / Self Care | Payer: BC Managed Care – PPO | Attending: Obstetrics & Gynecology | Admitting: Obstetrics & Gynecology

## 2019-11-02 ENCOUNTER — Other Ambulatory Visit: Payer: Self-pay

## 2019-11-02 VITALS — BP 132/98 | Wt 189.0 lb

## 2019-11-02 DIAGNOSIS — O0993 Supervision of high risk pregnancy, unspecified, third trimester: Secondary | ICD-10-CM | POA: Diagnosis not present

## 2019-11-02 DIAGNOSIS — O10919 Unspecified pre-existing hypertension complicating pregnancy, unspecified trimester: Secondary | ICD-10-CM | POA: Diagnosis not present

## 2019-11-02 DIAGNOSIS — O10913 Unspecified pre-existing hypertension complicating pregnancy, third trimester: Secondary | ICD-10-CM | POA: Diagnosis not present

## 2019-11-02 DIAGNOSIS — Z3A39 39 weeks gestation of pregnancy: Secondary | ICD-10-CM

## 2019-11-02 DIAGNOSIS — Z3A37 37 weeks gestation of pregnancy: Secondary | ICD-10-CM | POA: Diagnosis not present

## 2019-11-02 DIAGNOSIS — Z7982 Long term (current) use of aspirin: Secondary | ICD-10-CM | POA: Diagnosis not present

## 2019-11-02 DIAGNOSIS — O1002 Pre-existing essential hypertension complicating childbirth: Principal | ICD-10-CM | POA: Diagnosis present

## 2019-11-02 DIAGNOSIS — Z8616 Personal history of COVID-19: Secondary | ICD-10-CM

## 2019-11-02 DIAGNOSIS — O0990 Supervision of high risk pregnancy, unspecified, unspecified trimester: Secondary | ICD-10-CM

## 2019-11-02 DIAGNOSIS — R030 Elevated blood-pressure reading, without diagnosis of hypertension: Secondary | ICD-10-CM | POA: Diagnosis present

## 2019-11-02 DIAGNOSIS — F411 Generalized anxiety disorder: Secondary | ICD-10-CM | POA: Diagnosis present

## 2019-11-02 LAB — FETAL NONSTRESS TEST

## 2019-11-02 LAB — COMPREHENSIVE METABOLIC PANEL
ALT: 13 U/L (ref 0–44)
AST: 21 U/L (ref 15–41)
Albumin: 3 g/dL — ABNORMAL LOW (ref 3.5–5.0)
Alkaline Phosphatase: 148 U/L — ABNORMAL HIGH (ref 38–126)
Anion gap: 9 (ref 5–15)
BUN: 7 mg/dL (ref 6–20)
CO2: 21 mmol/L — ABNORMAL LOW (ref 22–32)
Calcium: 9 mg/dL (ref 8.9–10.3)
Chloride: 107 mmol/L (ref 98–111)
Creatinine, Ser: 0.71 mg/dL (ref 0.44–1.00)
GFR calc Af Amer: >60 mL/min (ref >60)
GFR calc non Af Amer: >60 mL/min (ref >60)
Glucose, Bld: 86 mg/dL (ref 70–99)
Potassium: 3.7 mmol/L (ref 3.5–5.1)
Sodium: 137 mmol/L (ref 135–145)
Total Bilirubin: 0.5 mg/dL (ref 0.3–1.2)
Total Protein: 6.4 g/dL — ABNORMAL LOW (ref 6.5–8.1)

## 2019-11-02 LAB — CBC
HCT: 36 % (ref 36.0–46.0)
Hemoglobin: 12.1 g/dL (ref 12.0–15.0)
MCH: 28.5 pg (ref 26.0–34.0)
MCHC: 33.6 g/dL (ref 30.0–36.0)
MCV: 84.9 fL (ref 80.0–100.0)
Platelets: 334 10*3/uL (ref 150–400)
RBC: 4.24 MIL/uL (ref 3.87–5.11)
RDW: 13.6 % (ref 11.5–15.5)
WBC: 10.5 10*3/uL (ref 4.0–10.5)
nRBC: 0 % (ref 0.0–0.2)

## 2019-11-02 LAB — TYPE AND SCREEN
ABO/RH(D): A POS
Antibody Screen: NEGATIVE

## 2019-11-02 LAB — PROTEIN / CREATININE RATIO, URINE
Creatinine, Urine: 42 mg/dL
Total Protein, Urine: <6 mg/dL

## 2019-11-02 LAB — ABO/RH: ABO/RH(D): A POS

## 2019-11-02 LAB — SARS CORONAVIRUS 2 BY RT PCR (HOSPITAL ORDER, PERFORMED IN ~~LOC~~ HOSPITAL LAB): SARS Coronavirus 2: NEGATIVE

## 2019-11-02 MED ORDER — BUTORPHANOL TARTRATE 1 MG/ML IJ SOLN: 1.0000 mg | INTRAMUSCULAR | Status: DC | PRN

## 2019-11-02 MED ORDER — LABETALOL HCL 5 MG/ML IV SOLN: 20.0000 mg | INTRAVENOUS | Status: DC | PRN

## 2019-11-02 MED ORDER — TERBUTALINE SULFATE 1 MG/ML IJ SOLN: 0.2500 mg | Freq: Once | INTRAMUSCULAR | Status: DC | PRN

## 2019-11-02 MED ORDER — OXYTOCIN 10 UNIT/ML IJ SOLN
INTRAMUSCULAR | Status: AC
  Filled 2019-11-02: qty 2

## 2019-11-02 MED ORDER — LABETALOL HCL 5 MG/ML IV SOLN: 80.0000 mg | INTRAVENOUS | Status: DC | PRN

## 2019-11-02 MED ORDER — OXYTOCIN-SODIUM CHLORIDE 30-0.9 UT/500ML-% IV SOLN
INTRAVENOUS | Status: AC
  Filled 2019-11-02: qty 1000

## 2019-11-02 MED ORDER — MISOPROSTOL 25 MCG QUARTER TABLET
25.0000 ug | ORAL_TABLET | ORAL | Status: DC | PRN
  Administered 2019-11-02: 25 ug via VAGINAL
  Filled 2019-11-02 (×2): qty 1

## 2019-11-02 MED ORDER — AMMONIA AROMATIC IN INHA
RESPIRATORY_TRACT | Status: AC
  Filled 2019-11-02: qty 10

## 2019-11-02 MED ORDER — MISOPROSTOL 200 MCG PO TABS
ORAL_TABLET | ORAL | Status: AC
  Filled 2019-11-02: qty 4

## 2019-11-02 MED ORDER — ACETAMINOPHEN 325 MG PO TABS: 650.0000 mg | ORAL_TABLET | ORAL | Status: DC | PRN

## 2019-11-02 MED ORDER — FENTANYL 2.5 MCG/ML W/ROPIVACAINE 0.15% IN NS 100 ML EPIDURAL (ARMC)
EPIDURAL | Status: AC
  Filled 2019-11-02: qty 100

## 2019-11-02 MED ORDER — HYDRALAZINE HCL 20 MG/ML IJ SOLN: 10.0000 mg | INTRAMUSCULAR | Status: DC | PRN

## 2019-11-02 MED ORDER — LABETALOL HCL 5 MG/ML IV SOLN: 40.0000 mg | INTRAVENOUS | Status: DC | PRN

## 2019-11-02 MED ORDER — LIDOCAINE HCL (PF) 1 % IJ SOLN: 30.0000 mL | INTRAMUSCULAR | Status: DC | PRN

## 2019-11-02 MED ORDER — ONDANSETRON HCL 4 MG/2ML IJ SOLN
4.0000 mg | Freq: Four times a day (QID) | INTRAMUSCULAR | Status: DC | PRN
  Administered 2019-11-03: 4 mg via INTRAVENOUS
  Filled 2019-11-02: qty 2

## 2019-11-02 MED ORDER — OXYTOCIN BOLUS FROM INFUSION
500.0000 mL | Freq: Once | INTRAVENOUS | Status: AC
  Administered 2019-11-03: 500 mL via INTRAVENOUS

## 2019-11-02 MED ORDER — OXYTOCIN-SODIUM CHLORIDE 30-0.9 UT/500ML-% IV SOLN: 2.5000 [IU]/h | INTRAVENOUS | Status: DC

## 2019-11-02 MED ORDER — LACTATED RINGERS IV SOLN
INTRAVENOUS | Status: DC
  Administered 2019-11-02: 1000 mL via INTRAVENOUS

## 2019-11-02 MED ORDER — LIDOCAINE HCL (PF) 1 % IJ SOLN
INTRAMUSCULAR | Status: AC
  Filled 2019-11-02: qty 30

## 2019-11-02 MED ORDER — LACTATED RINGERS IV SOLN: 500.0000 mL | INTRAVENOUS | Status: DC | PRN

## 2019-11-02 MED ORDER — OXYTOCIN-SODIUM CHLORIDE 30-0.9 UT/500ML-% IV SOLN
1.0000 m[IU]/min | INTRAVENOUS | Status: DC
  Administered 2019-11-02: 1 m[IU]/min via INTRAVENOUS

## 2019-11-02 MED ORDER — MISOPROSTOL 25 MCG QUARTER TABLET
25.0000 ug | ORAL_TABLET | Freq: Once | ORAL | Status: AC
  Administered 2019-11-02: 25 ug via BUCCAL
  Filled 2019-11-02: qty 1

## 2019-11-02 NOTE — H&P (Signed)
OB History & Physical   History of Present Illness:  Chief Complaint: elevated blood pressure at term  HPI:  Kiara Franklin is a 24 y.o. G1P0000 female at [redacted]w[redacted]d dated by 8 week ultrasound.  Her pregnancy has been complicated by chronic hypertension not on medication.    She denies contractions.   She denies leakage of fluid.   She denies vaginal bleeding.   She reports fetal movement.    She denies headache, visual changes or epigastric pain.  Patient had Covid in January. Will test today.  Total weight gain for pregnancy: 19.1 kg   Obstetrical Problem List: Pregnancy#1 Problems (from 01/30/19 to present)    Problem Noted Resolved   Supervision of high risk pregnancy, antepartum 03/28/2019 by Conard Novak, MD No   Overview Addendum 11/02/2019  8:17 AM by Mirna Mires, CNM      Clinic [facility] Prenatal Labs  Dating  8 wk [place] Blood type: A/Positive/-- (10/28 1542)   Genetic Screen 1 Screen:    MaternT diploid XX AFP:      Quad:      NIPS:    Antibody:Negative (10/28 1542)  Anatomic [place]  Rubella: <0.90 not immune Varicella: Immune  GTT Early:        28 wk:      RPR: Non Reactive (10/28 1542)   Rhogam  not needed HBsAg: Negative (10/28 1542)   TDaP vaccine   09/10/19                   HIV: Non Reactive (10/28 1542)   Flu Shot   03/28/2019                             GBS: negative GC/CT negative/negative  Contraception  Pap: NIL  CBB     CS/VBAC NA Chronic hypertension  Baby Food    Support Person  Husband and mom           Chronic hypertension affecting pregnancy 03/23/2019 by Particia Nearing, PA-C No   Overview Addendum 06/13/2019 10:55 AM by Conard Novak, MD    Blood pressures elevated in clinic. She checks her BPs daily at home and they have been normal. She has brought her home BP machine to clinic and the values are about the same. Encouraged daily BP checks Start bASA after 12 weeks, just in case. Normal baseline labs.  Treat like cHTN [x]   Aspirin 81 mg daily after 12 weeks; discontinue after 36 weeks [x]  baseline labs with CBC, CMP, urine protein/creatinine ratio [ ]  no BP meds unless BPs become elevated [ ]  ultrasound for growth at 28, 32, 36 weeks [ ]  Baseline EKG   Current antihypertensives:  None   Baseline and surveillance labs (pulled in from [facility], refresh links as needed)  Lab Results  Component Value Date   PLT 396 03/28/2019   CREATININE 0.59 03/28/2019   AST 15 03/28/2019   ALT 10 03/28/2019    Antenatal Testing CHTN - O10.919  Group I  BP < 140/90, no preeclampsia, AGA,  nml AFV, +/- meds    Group II BP > 140/90, on meds, no preeclampsia, AGA, nml AFV  20-28-34-38  20-24-28-32-35-38  32//2 x wk  28//BPP wkly then 32//2 x wk  40 no meds; 39 meds  PRN or 37  Pre-eclampsia  GHTN - O13.9/Preeclampsia without severe features  - O14.00   Preeclampsia with severe features - O14.10  Q  3-4wks  Q 2 wks  28//BPP wkly then 32//2 x wk  Inpatient  37  PRN or 34             Maternal Medical History:   Past Medical History:  Diagnosis Date  . Acne     Past Surgical History:  Procedure Laterality Date  . KNEE SURGERY Left 11/2012    No Known Allergies  Prior to Admission medications   Medication Sig Start Date End Date Taking? Authorizing Provider  Prenatal Vit-Fe Fumarate-FA (MULTIVITAMIN-PRENATAL) 27-0.8 MG TABS tablet Take 1 tablet by mouth daily at 12 noon.   Yes [provider]  meclizine (ANTIVERT) 25 MG tablet Take 1 tablet (25 mg total) by mouth 3 (three) times daily as needed for dizziness. Patient not taking: Reported on 04/04/2019 02/19/19   Renford Dills, NP  triamcinolone cream (KENALOG) 0.1 % Apply 1 application topically 2 (two) times daily. Patient not taking: Reported on 04/18/2019 03/27/19   Particia Nearing, PA-C    OB History  Gravida Para Term Preterm AB Living  1 0 0 0 0 0  SAB TAB Ectopic Multiple Live Births  0 0 0 0 0    # Outcome Date GA  Lbr Len/2nd Weight Sex Delivery Anes PTL Lv  1 Current             Prenatal care site: [facility]  Social History: She  reports that she has never smoked. She has never used smokeless tobacco. She reports that she does not drink alcohol or use drugs.  Family History: family history includes Asthma in her mother; Cancer in her maternal grandmother; Healthy in her father; Thyroid disease in her maternal aunt.    Review of Systems:  Review of Systems  Constitutional: Negative for chills and fever.  HENT: Negative for congestion, ear discharge, ear pain, hearing loss, sinus pain and sore throat.   Eyes: Negative for blurred vision and double vision.  Respiratory: Negative for cough, shortness of breath and wheezing.   Cardiovascular: Negative for chest pain, palpitations and leg swelling.  Gastrointestinal: Negative for abdominal pain, blood in stool, constipation, diarrhea, heartburn, melena, nausea and vomiting.  Genitourinary: Negative for dysuria, flank pain, frequency, hematuria and urgency.  Musculoskeletal: Negative for back pain, joint pain and myalgias.  Skin: Negative for itching and rash.  Neurological: Negative for dizziness, tingling, tremors, sensory change, speech change, focal weakness, seizures, loss of consciousness, weakness and headaches.  Endo/Heme/Allergies: Negative for environmental allergies. Does not bruise/bleed easily.  Psychiatric/Behavioral: Negative for depression, hallucinations, memory loss, substance abuse and suicidal ideas. The patient is not nervous/anxious and does not have insomnia.      Physical Exam:  BP (!) 153/102   Pulse (!) 118   Temp 98.8 F (37.1 C) (Oral)   Resp 16   Ht 5\' 4"  (1.626 m)   Wt 85.7 kg   LMP 01/25/2019 (Approximate)   BMI 32.44 kg/m   Constitutional: Well nourished, well developed female in no acute distress.  HEENT: normal Skin: Warm and dry.  Cardiovascular: Regular rate and rhythm.   Extremity: trace edema   Respiratory: Clear to auscultation bilateral. Normal respiratory effort Abdomen: FHT present Back: no CVAT Neuro: DTRs 2+, Cranial nerves grossly intact Psych: Alert and Oriented x3. No memory deficits. Normal mood and affect.  MS: normal gait, normal bilateral lower extremity ROM/strength/stability.  Pelvic exam: exam done in the office this morning by M. 01/27/2019 CNM 1.5 cm/50%   Baseline FHR: 135 beats/min  Variability: moderate   Accelerations: present   Decelerations: absent Contractions: present frequency: occasional Overall assessment: reassuring  Patient Vitals for the past 24 hrs:  BP Temp Temp src Pulse Resp Height Weight  11/02/19 1125 (!) 153/102 -- -- (!) 118 -- -- --  11/02/19 1110 (!) 145/101 -- -- (!) 105 -- -- --  11/02/19 1055 (!) 154/106 -- -- (!) 112 -- -- --  11/02/19 1040 (!) 157/109 -- -- (!) 109 -- -- --  11/02/19 1025 (!) 150/107 -- -- (!) 107 -- -- --  11/02/19 1010 (!) 151/105 -- -- (!) 109 -- -- --  11/02/19 1000 (!) 149/107 98.8 F (37.1 C) Oral (!) 101 16 5\' 4"  (1.626 m) 85.7 kg    Recent growth scan: Result Date: 10/24/2019 Patient Name: Cordia Miklos DOB: 1995-12-20 MRN: [id number] ULTRASOUND REPORT Location: [facility] Date of Service: 10/24/2019 Indications:growth/afi Findings: [name] intrauterine pregnancy is visualized with FHR at 136 BPM. Biometrics give an (U/S) Gestational age of [redacted]w[redacted]d and an (U/S) EDD of 11/15/2019; this correlates with the clinically established Estimated Date of Delivery: 11/09/19. Fetal presentation is Cephalic. Placenta: posterior. Grade: 2 AFI: 14.2 cm Growth percentile is 36.8%.  AC percentile is 31.5%. EFW: 3,022 g  (6 lb 11 oz) Impression: 1. [redacted]w[redacted]d Viable Singleton Intrauterine pregnancy previously established criteria. 2. Growth is 36.8 %ile.  AFI is 14.2 cm. Gweneth Dimitri, RT The ultrasound images and findings were reviewed by me and I agree with the above report. Prentice Docker, MD, [facility], [facility] 10/24/2019 10:31 AM      No results found for: SARSCOV2NAA]  Assessment:  Haila Maya is a 24 y.o. G1P0000 female at [redacted]w[redacted]d with worsening chronic hypertension.   Plan:  1. Admit to Labor & Delivery  2. CBC, T&S, Clrs, IVF 3. GBS negative.   4. Fetal well-being: Category I 5. Begin induction with cytotec: 25 mcg vaginal, 25 mcg buccal 6. Hypertension: Labetalol protocol for severe range pressure 7. Magnesium Sulfate if pressures become severe   Rod Can, CNM 11/02/2019 11:55 AM

## 2019-11-02 NOTE — Patient Instructions (Signed)

## 2019-11-02 NOTE — Progress Notes (Signed)
ROB  AFI/ NST B/P recheck 144/102

## 2019-11-02 NOTE — Progress Notes (Signed)
°  Labor Progress Note   24 y.o. G1P0000 @ [redacted]w[redacted]d , admitted for  Pregnancy, Labor Management.   Subjective:  No pain Contracting regularly by monitor BP 140s/90s; denies ha, blurry vision, CP, SOB, epigastric pain  Objective:  BP (!) 146/97    Pulse 100    Temp 97.8 F (36.6 C) (Oral)    Resp 16    Ht 5\' 4"  (1.626 m)    Wt 85.7 kg    LMP 01/25/2019 (Approximate)    BMI 32.44 kg/m  Abd: gravid, ND, FHT present, mild tenderness on exam Extr: trace to 1+ bilateral pedal edema SVE: CERVIX: 2 cm dilated, 80 effaced, -3 station  EFM: FHR: 150 bpm, variability: moderate,  accelerations:  Present,  decelerations:  Absent Toco: Frequency: Every 2-3 minutes Labs: I have reviewed the patient's lab results.   Assessment & Plan:  G1P0000 @ [redacted]w[redacted]d, admitted for  Pregnancy and Labor/Delivery Management  1. Pain management: none. 2. FWB: FHT category 1.  3. ID: GBS negative 4. Labor management: Pitocin augmentation Monitor BP and for s/sx preeclampsia  All discussed with patient, see orders  [redacted]w[redacted]d, MD, Annamarie Major Ob/Gyn, [facility] 11/02/2019  5:50 PM

## 2019-11-02 NOTE — Progress Notes (Signed)
Routine Prenatal Care Visit  Subjective  Kiara Franklin is a 24 y.o. G1P0000 at [redacted]w[redacted]d being seen today for ongoing prenatal care.  She is currently monitored for the following issues for this high-risk pregnancy and has Chronic hypertension affecting pregnancy and Supervision of high risk pregnancy, antepartum on their problem list. She is feeling well. Denies headaches, visual changes or edema. Today is her last day at work. The baby moves well. Aware that her labor maybe induced.----------------------------------------------------------------------------------- Patient reports no complaints.    .  .   Pincus Large Fluid denies.  ----------------------------------------------------------------------------------- The following portions of the patient's history were reviewed and updated as appropriate: allergies, current medications, past family history, past medical history, past social history, past surgical history and problem list. Problem list updated.  Objective  Last menstrual period 01/25/2019. Pregravid weight 147 lb (66.7 kg) Total Weight Gain 39 lb (17.7 kg) Urinalysis: Urine Protein    Urine Glucose    Fetal Status:           General:  Alert, oriented and cooperative. Patient is in no acute distress.  Skin: Skin is warm and dry. No rash noted.   Cardiovascular: Normal heart rate noted  Respiratory: Normal respiratory effort, no problems with respiration noted  Abdomen: Soft, gravid, appropriate for gestational age.       Pelvic:  Cervical exam performed        Extremities: Normal range of motion.     Mental Status: Normal mood and affect. Normal behavior. Normal judgment and thought content.   Assessment   24 y.o. G1P0000 at [redacted]w[redacted]d by  11/09/2019, by Ultrasound presenting for routine prenatal visit Chronic HTN at term. Elevated BP today. Reactive NST Normal AFI (16)  Plan   Pregnancy#1 Problems (from 01/30/19 to present)    Problem Noted Resolved   Supervision of high risk  pregnancy, antepartum 03/28/2019 by Conard Novak, MD No   Overview Addendum 11/02/2019  8:17 AM by Mirna Mires, CNM      Clinic [facility] Prenatal Labs  Dating  8 wk [place] Blood type: A/Positive/-- (10/28 1542)   Genetic Screen 1 Screen:    MaternT diploid XX AFP:      Quad:      NIPS:    Antibody:Negative (10/28 1542)  Anatomic [place]  Rubella: <0.90 not immune Varicella: Immune  GTT Early:        28 wk:      RPR: Non Reactive (10/28 1542)   Rhogam  not needed HBsAg: Negative (10/28 1542)   TDaP vaccine                       HIV: Non Reactive (10/28 1542)   Flu Shot   03/28/2019                             negative  Contraception  Pap: NIL  CBB     CS/VBAC    Baby Food    Support Person             Chronic hypertension affecting pregnancy 03/23/2019 by Particia Nearing, PA-C No   Overview Addendum 06/13/2019 10:55 AM by Conard Novak, MD    Blood pressures elevated in clinic. She checks her BPs daily at home and they have been normal. She has brought her home BP machine to clinic and the values are about the same. Encouraged daily BP checks Start bASA after 12  weeks, just in case. Normal baseline labs.  Treat like cHTN [x]  Aspirin 81 mg daily after 12 weeks; discontinue after 36 weeks [x]  baseline labs with CBC, CMP, urine protein/creatinine ratio [ ]  no BP meds unless BPs become elevated [ ]  ultrasound for growth at 28, 32, 36 weeks [ ]  Baseline EKG   Current antihypertensives:  None   Baseline and surveillance labs (pulled in from [facility], refresh links as needed)  Lab Results  Component Value Date   PLT 396 03/28/2019   CREATININE 0.59 03/28/2019   AST 15 03/28/2019   ALT 10 03/28/2019    Antenatal Testing CHTN - O10.919  Group I  BP < 140/90, no preeclampsia, AGA,  nml AFV, +/- meds    Group II BP > 140/90, on meds, no preeclampsia, AGA, nml AFV  20-28-34-38  20-24-28-32-35-38  32//2 x wk  28//BPP wkly then 32//2 x wk  40 no meds; 39  meds  PRN or 37  Pre-eclampsia  GHTN - O13.9/Preeclampsia without severe features  - O14.00   Preeclampsia with severe features - O14.10  Q 3-4wks  Q 2 wks  28//BPP wkly then 32//2 x wk  Inpatient  37  PRN or 34             Term labor symptoms and general obstetric precautions including but not limited to vaginal bleeding, contractions, leaking of fluid and fetal movement were reviewed in detail with the patient. Please refer to After Visit Summary for other counseling recommendations.   In light of her elevated BPs today, she is sent to L and D for serial BPs and monitoring. Bishop score is 3. Discussed at length the IOL process, including cervical ripening.  Should she not be induced today, will likely set her up for next week. [place] labs to be drawn at [facility].  Rod Can CNM is contacted and notified of POC.  Imagene Riches, CNM  11/02/2019 8:19 AM

## 2019-11-02 NOTE — OB Triage Note (Signed)
Pt presents from OBGYN office for [facility] work-up. Pt denies pain, blurry vision, HA, LOF or bleeding. Reports positive fetal movement. +1 edema noted on lower bilateral extremities. +2 reflexes, lungs clear. BP cycling q 15 mins. Will continue to monitor.

## 2019-11-03 ENCOUNTER — Encounter: Payer: Self-pay | Admitting: Obstetrics & Gynecology

## 2019-11-03 ENCOUNTER — Inpatient Hospital Stay: Payer: BC Managed Care – PPO | Admitting: Anesthesiology

## 2019-11-03 DIAGNOSIS — O0990 Supervision of high risk pregnancy, unspecified, unspecified trimester: Secondary | ICD-10-CM

## 2019-11-03 DIAGNOSIS — O10919 Unspecified pre-existing hypertension complicating pregnancy, unspecified trimester: Secondary | ICD-10-CM

## 2019-11-03 DIAGNOSIS — O139 Gestational [pregnancy-induced] hypertension without significant proteinuria, unspecified trimester: Secondary | ICD-10-CM

## 2019-11-03 LAB — RPR: RPR Ser Ql: NONREACTIVE

## 2019-11-03 MED ORDER — SIMETHICONE 80 MG PO CHEW: 80.0000 mg | CHEWABLE_TABLET | ORAL | Status: DC | PRN

## 2019-11-03 MED ORDER — OXYCODONE-ACETAMINOPHEN 5-325 MG PO TABS
2.0000 | ORAL_TABLET | ORAL | Status: DC | PRN
  Administered 2019-11-03: 2 via ORAL
  Filled 2019-11-03: qty 2

## 2019-11-03 MED ORDER — SODIUM CHLORIDE 0.9% FLUSH: 3.0000 mL | INTRAVENOUS | Status: DC | PRN

## 2019-11-03 MED ORDER — OXYCODONE-ACETAMINOPHEN 5-325 MG PO TABS: 1.0000 | ORAL_TABLET | ORAL | Status: DC | PRN

## 2019-11-03 MED ORDER — SENNOSIDES-DOCUSATE SODIUM 8.6-50 MG PO TABS
2.0000 | ORAL_TABLET | ORAL | Status: DC
  Administered 2019-11-03: 2 via ORAL
  Filled 2019-11-03: qty 2

## 2019-11-03 MED ORDER — SODIUM CHLORIDE 0.9% FLUSH
3.0000 mL | Freq: Two times a day (BID) | INTRAVENOUS | Status: DC
  Administered 2019-11-03: 3 mL via INTRAVENOUS

## 2019-11-03 MED ORDER — EPHEDRINE 5 MG/ML INJ: 10.0000 mg | INTRAVENOUS | Status: DC | PRN

## 2019-11-03 MED ORDER — ONDANSETRON HCL 4 MG PO TABS: 4.0000 mg | ORAL_TABLET | ORAL | Status: DC | PRN

## 2019-11-03 MED ORDER — SODIUM CHLORIDE 0.9 % IV SOLN: 250.0000 mL | INTRAVENOUS | Status: DC | PRN

## 2019-11-03 MED ORDER — FENTANYL 2.5 MCG/ML W/ROPIVACAINE 0.15% IN NS 100 ML EPIDURAL (ARMC): 12.0000 mL/h | EPIDURAL | Status: DC

## 2019-11-03 MED ORDER — COCONUT OIL OIL
1.0000 "application " | TOPICAL_OIL | Status: DC | PRN
  Administered 2019-11-03: 1 via TOPICAL
  Filled 2019-11-03: qty 120

## 2019-11-03 MED ORDER — DIBUCAINE (PERIANAL) 1 % EX OINT: 1.0000 "application " | TOPICAL_OINTMENT | CUTANEOUS | Status: DC | PRN

## 2019-11-03 MED ORDER — ZOLPIDEM TARTRATE 5 MG PO TABS: 5.0000 mg | ORAL_TABLET | Freq: Every evening | ORAL | Status: DC | PRN

## 2019-11-03 MED ORDER — SODIUM CHLORIDE 0.9 % IV SOLN
INTRAVENOUS | Status: DC | PRN
  Administered 2019-11-03 (×2): 5 mL via EPIDURAL

## 2019-11-03 MED ORDER — ONDANSETRON HCL 4 MG/2ML IJ SOLN: 4.0000 mg | INTRAMUSCULAR | Status: DC | PRN

## 2019-11-03 MED ORDER — PHENYLEPHRINE 40 MCG/ML (10ML) SYRINGE FOR IV PUSH (FOR BLOOD PRESSURE SUPPORT): 80.0000 ug | PREFILLED_SYRINGE | INTRAVENOUS | Status: DC | PRN

## 2019-11-03 MED ORDER — LIDOCAINE-EPINEPHRINE (PF) 1.5 %-1:200000 IJ SOLN
INTRAMUSCULAR | Status: DC | PRN
  Administered 2019-11-03: 3 mL via EPIDURAL

## 2019-11-03 MED ORDER — LIDOCAINE HCL (PF) 1 % IJ SOLN
INTRAMUSCULAR | Status: DC | PRN
  Administered 2019-11-03: 3 mL via SUBCUTANEOUS

## 2019-11-03 MED ORDER — LACTATED RINGERS IV SOLN: 500.0000 mL | Freq: Once | INTRAVENOUS | Status: DC

## 2019-11-03 MED ORDER — FENTANYL 2.5 MCG/ML W/ROPIVACAINE 0.15% IN NS 100 ML EPIDURAL (ARMC)
EPIDURAL | Status: DC | PRN
  Administered 2019-11-03: 12 mL/h via EPIDURAL

## 2019-11-03 MED ORDER — IBUPROFEN 600 MG PO TABS
600.0000 mg | ORAL_TABLET | Freq: Four times a day (QID) | ORAL | Status: DC
  Administered 2019-11-03 – 2019-11-04 (×6): 600 mg via ORAL
  Filled 2019-11-03 (×6): qty 1

## 2019-11-03 MED ORDER — DIPHENHYDRAMINE HCL 50 MG/ML IJ SOLN: 12.5000 mg | INTRAMUSCULAR | Status: DC | PRN

## 2019-11-03 MED ORDER — DIPHENHYDRAMINE HCL 25 MG PO CAPS: 25.0000 mg | ORAL_CAPSULE | Freq: Four times a day (QID) | ORAL | Status: DC | PRN

## 2019-11-03 MED ORDER — BENZOCAINE-MENTHOL 20-0.5 % EX AERO
1.0000 "application " | INHALATION_SPRAY | CUTANEOUS | Status: DC | PRN
  Administered 2019-11-03: 1 via TOPICAL
  Filled 2019-11-03: qty 56

## 2019-11-03 MED ORDER — ACETAMINOPHEN 325 MG PO TABS: 650.0000 mg | ORAL_TABLET | ORAL | Status: DC | PRN

## 2019-11-03 MED ORDER — WITCH HAZEL-GLYCERIN EX PADS: 1.0000 "application " | MEDICATED_PAD | CUTANEOUS | Status: DC | PRN

## 2019-11-03 NOTE — Anesthesia Preprocedure Evaluation (Signed)
Anesthesia Evaluation  Patient identified by MRN, date of birth, ID band Patient awake    Reviewed: Allergy & Precautions, H&P , NPO status , Patient's Chart, lab work & pertinent test results, reviewed documented beta blocker date and time   History of Anesthesia Complications Negative for: history of anesthetic complications  Airway Mallampati: II  TM Distance: >3 FB Neck ROM: full    Dental no notable dental hx.    Pulmonary neg pulmonary ROS,    Pulmonary exam normal breath sounds clear to auscultation       Cardiovascular Exercise Tolerance: Good hypertension, (-) angina(-) Past MI and (-) Cardiac Stents Normal cardiovascular exam(-) dysrhythmias (-) Valvular Problems/Murmurs Rhythm:regular Rate:Normal     Neuro/Psych negative neurological ROS  negative psych ROS   GI/Hepatic Neg liver ROS, GERD  ,  Endo/Other  negative endocrine ROS  Renal/GU negative Renal ROS  negative genitourinary   Musculoskeletal   Abdominal   Peds  Hematology negative hematology ROS (+)   Anesthesia Other Findings Past Medical History: No date: Acne   Reproductive/Obstetrics (+) Pregnancy                             Anesthesia Physical Anesthesia Plan  ASA: II  Anesthesia Plan: Epidural   Post-op Pain Management:    Induction:   PONV Risk Score and Plan:   Airway Management Planned:   Additional Equipment:   Intra-op Plan:   Post-operative Plan:   Informed Consent: I have reviewed the patients History and Physical, chart, labs and discussed the procedure including the risks, benefits and alternatives for the proposed anesthesia with the patient or authorized representative who has indicated his/her understanding and acceptance.     Dental Advisory Given  Plan Discussed with: Anesthesiologist, CRNA and Surgeon  Anesthesia Plan Comments:         Anesthesia Quick Evaluation

## 2019-11-03 NOTE — Discharge Instructions (Signed)

## 2019-11-03 NOTE — Progress Notes (Signed)
Post Partum Day 0 Subjective: no complaints, voiding, tolerating PO and deniews heavy bleeding. No headahce, blurred vision. Feels tired and has not slept since delivery  Objective: Blood pressure 119/78, pulse 88, temperature 99.1 F (37.3 C), temperature source Oral, resp. rate 18, height 5\' 4"  (1.626 m), weight 85.7 kg, last menstrual period 01/25/2019, SpO2 100 %, unknown if currently breastfeeding.  Physical Exam:  General: alert and cooperative Lochia: appropriate Uterine Fundus: firm Incision: intact perineum, ice pack in place DVT Evaluation: No evidence of DVT seen on physical exam. Negative Homan's sign.  Recent Labs    11/02/19 1030  HGB 12.1  HCT 36.0    Assessment/Plan: Routine postpartum care. Support breastfeeding. Monitor BPs and notify provider on call for elevated BPs   LOS: 1 day   01/02/20 11/03/2019, 9:59 AM

## 2019-11-03 NOTE — Discharge Summary (Signed)
Postpartum Discharge Summary  Date of Service updated 11/04/2019     Patient Name: Kiara Franklin DOB: [birth year] MRN: [id number]  Date of admission: 11/02/2019 Delivery date:   Dhalia, Zingaro Girl Kalianne [[id number]]      Melvie, Paglia [[id number]]  11/03/2019   Delivering provider: Gae Dry   Date of discharge: 11/04/2019  Admitting diagnosis: Indication for care in labor and delivery, antepartum [O75.9] Chronic hypertension with exacerbation during pregnancy in third trimester [O10.913] Intrauterine pregnancy: [redacted]w[redacted]d    Secondary diagnosis:  Active Problems:   Indication for care in labor and delivery, antepartum   Chronic hypertension with exacerbation during pregnancy in third trimester  Additional problems: None    Discharge diagnosis: Term Pregnancy Delivered                                              Post partum procedures:routine postpartum care Augmentation: AROM, Pitocin and Cytotec Complications: None  Hospital course: Induction of Labor With Vaginal Delivery   24y.o. yo G1P0000 at 337w1das admitted to the hospital 11/02/2019 for induction of labor.  Indication for induction: chronic HTN.  Patient had an uncomplicated labor course as follows: Membrane Rupture Time/Date: Kelvin Sennett, Cockerillirl Modesty [0[[id number]]12:56 AM    HiGeraldean, Walenophia [0[[id number]]12:56 AM  ,   HiShonteria, Abelnirl Abbigayle [0[[id number]]11/03/2019    HiTamarra, Geiselmanophia [0[[id number]]11/03/2019    Delivery Method:   HiEdwin Dadairl Tifanie [0[[id number]]Vaginal, Spontaneous    HiDesree, Leapophia [0[[id number]]Vaginal, Spontaneous   Episiotomy:    HiJosslin, Sanjuanirl Nickola [0[[id number]]    HiClaudell, Rhodyophia [0[[id number]]   Lacerations:     HiCorby, Villasenorirl Kirin [0[[id number]]    HiBeyonca, Wisz0[[id number]]1st degree;Periurethral   Details of delivery can be found in separate delivery note.  Patient had a routine postpartum course. Patient is discharged home  11/03/19.  Newborn Data: Birth date:   HiSitlaly, Gudiel0[[id number]]    HiSalsabeel, Gorelickophia [0[[id number]]11/03/2019   Birth time:   HiPocahontasGirl Azarria [0[[id number]]    HiMileah, Hemmerophia [0[[id number]]5:48 AM   Gender:   HiSharel, Behneirl Montia [0[[id number]]Female    HiAzharia, Surrattophia [0[[id number]]Female   Living status:   HiMileena, Rothenbergerirl Renesmay [0[[id number]]    HiAlithia, Zavaletaophia [0[[id number]]Living   Apgars:   HiSumer, Moorehouseirl Anice [0[[id number]]    HiMalu, Pellegriniophia [0[[id number]]8 Milo,   HiGuanicaGirl Raveen [0[[id number]]    HiZelia, Yzaguirreophia [0[[id number]]9   Weight:   HiEmmalea, Treanorirl Lisia [0[[id number]]    HiAdreona, Brandophia [0[[id number]]    Magnesium Sulfate received: No BMZ received: No Rhophylac:No MMR:Yes T-DaP:Given prenatally Flu: No Transfusion:No  Physical exam  Vitals:   11/03/19 0119 11/03/19 0120 11/03/19 0216 11/03/19 0419  BP: 127/66  125/78   Pulse: 99  90   Resp:      Temp:    99.3 F (37.4 C)  TempSrc:    Oral  SpO2:  98%    Weight:      Height:       General: alert, cooperative and no distress Lochia: appropriate Uterine  Fundus: firm Incision: Healing well with no significant drainage DVT Evaluation: No evidence of DVT seen on physical exam. Negative Homan's sign. No cords or calf tenderness. Labs: Lab Results  Component Value Date   WBC 10.5 11/02/2019   HGB 12.1 11/02/2019   HCT 36.0 11/02/2019   MCV 84.9 11/02/2019   PLT 334 11/02/2019   CMP Latest Ref Rng & Units 11/02/2019  Glucose 70 - 99 mg/dL 86  BUN 6 - 20 mg/dL 7  Creatinine 0.44 - 1.00 mg/dL 0.71  Sodium 135 - 145 mmol/L 137  Potassium 3.5 - 5.1 mmol/L 3.7  Chloride 98 - 111 mmol/L 107  CO2 22 - 32 mmol/L 21(L)  Calcium 8.9 - 10.3 mg/dL 9.0  Total Protein 6.5 - 8.1 g/dL 6.4(L)  Total Bilirubin 0.3 - 1.2 mg/dL 0.5  Alkaline Phos 38 - 126 U/L 148(H)  AST 15 - 41 U/L 21  ALT 0 - 44 U/L 13   Edinburgh Score: No flowsheet data  found.    After visit meds:  Allergies as of 11/03/2019   No Known Allergies         Discharge home in stable condition Infant Feeding: Breast Infant Disposition:home with mother Discharge instruction: per After Visit Summary and Postpartum booklet. Activity: Advance as tolerated. Pelvic rest for 6 weeks.  Diet: routine diet Anticipated Birth Control: Unsure Postpartum Appointment:6 weeks Additional Postpartum F/U: Postpartum Depression checkup and BP check 1 week Future Appointments: Future Appointments  Date Time Provider [place]  11/05/2019  9:10 AM Will Bonnet, MD [facility] None   Follow up Visit: Follow-up Information    Gae Dry, MD. Schedule an appointment as soon as possible for a visit in 6 week(s).   Specialty: Obstetrics and Gynecology Contact information: [address] [place] [address] [phone]              11/04/2019 Imagene Riches, CNM  11/04/2019 4:43 PM

## 2019-11-03 NOTE — Progress Notes (Signed)
FHT returned WDL prior 10 mins of position changes for noted decels starting at 0402. Difficult to assess FHT due to position changes and similiarly to maternal heart rate. Noted similarity at 0413 adjusted [place] and found FHT WDL.

## 2019-11-03 NOTE — Progress Notes (Signed)
  Labor Progress Note   24 y.o. G1P0000 @ [redacted]w[redacted]d , admitted for  Pregnancy, Labor Management.   Subjective:  Mild pressure  Objective:  BP 125/78 (BP Location: Right Arm)   Pulse 90   Temp 98.7 F (37.1 C) (Oral)   Resp 18   Ht 5\' 4"  (1.626 m)   Wt 85.7 kg   LMP 01/25/2019 (Approximate)   SpO2 98%   BMI 32.44 kg/m  Abd: gravid, ND, FHT present, mild tenderness on exam Extr: trace to 1+ bilateral pedal edema SVE: CERVIX: 9 cm dilated, 100 effaced, +1 station  EFM: FHR: 140 bpm, variability: moderate,  accelerations:  Present,  decelerations:  Absent Toco: Frequency: Every 3-4 minutes Labs: I have reviewed the patient's lab results.   Assessment & Plan:  G1P0000 @ [redacted]w[redacted]d, admitted for  Pregnancy and Labor/Delivery Management  1. Pain management: epidural. 2. FWB: FHT category 1.  3. ID: GBS negative 4. Labor management: Second stage soon  All discussed with patient, see orders  [redacted]w[redacted]d, MD, Annamarie Major Ob/Gyn, [facility] 11/03/2019  3:50 AM

## 2019-11-03 NOTE — Progress Notes (Signed)
Pt c/o not having received lunch of dinner. Dietary called and Jacki Cones  From Dietary stated 3x's in attempt to get lunch order and pt and family did not order at that time. At dinner Jacki Cones states pt requested 4 entrees and Misty Stanley explained that they can only 2 entrees. At change of shift pt stated that it was too late to order. Jacki Cones stated they can order until 7:30pm. Pt told she can order and pt states she is ordering now @1910 .

## 2019-11-03 NOTE — Plan of Care (Signed)
Alert and oriented with quiet affect. Color sl. Good, skin w&d. BBS clear. Fundus is firm with scant Lochia. Denies c/o. Appears comfortable and in NAD.

## 2019-11-03 NOTE — Anesthesia Procedure Notes (Signed)
Epidural Patient location during procedure: OB Start time: 11/03/2019 12:18 AM End time: 11/03/2019 12:24 AM  Staffing Anesthesiologist: Lenard Simmer, MD Performed: anesthesiologist   Preanesthetic Checklist Completed: patient identified, IV checked, site marked, risks and benefits discussed, surgical consent, monitors and equipment checked, pre-op evaluation and timeout performed  Epidural Patient position: sitting Prep: ChloraPrep Patient monitoring: heart rate, continuous pulse ox and blood pressure Approach: midline Location: L3-L4 Injection technique: LOR saline  Needle:  Needle type: Tuohy  Needle gauge: 17 G Needle length: 9 cm and 9 Needle insertion depth: 4 cm Catheter type: closed end flexible Catheter size: 19 Gauge Catheter at skin depth: 9 cm Test dose: negative and 1.5% lidocaine with Epi 1:200 K  Assessment Sensory level: T10 Events: blood not aspirated, injection not painful, no injection resistance, no paresthesia and negative IV test  Additional Notes 1st attempt Pt. Evaluated and documentation done after procedure finished. Patient identified. Risks/Benefits/Options discussed with patient including but not limited to bleeding, infection, nerve damage, paralysis, failed block, incomplete pain control, headache, blood pressure changes, nausea, vomiting, reactions to medication both or allergic, itching and postpartum back pain. Confirmed with bedside nurse the patient's most recent platelet count. Confirmed with patient that they are not currently taking any anticoagulation, have any bleeding history or any family history of bleeding disorders. Patient expressed understanding and wished to proceed. All questions were answered. Sterile technique was used throughout the entire procedure. Please see nursing notes for vital signs. Test dose was given through epidural catheter and negative prior to continuing to dose epidural or start infusion. Warning signs of high  block given to the patient including shortness of breath, tingling/numbness in hands, complete motor block, or any concerning symptoms with instructions to call for help. Patient was given instructions on fall risk and not to get out of bed. All questions and concerns addressed with instructions to call with any issues or inadequate analgesia.   Patient tolerated the insertion well without immediate complications.Reason for block:procedure for pain

## 2019-11-04 ENCOUNTER — Ambulatory Visit: Payer: Self-pay

## 2019-11-04 DIAGNOSIS — F411 Generalized anxiety disorder: Secondary | ICD-10-CM | POA: Diagnosis present

## 2019-11-04 LAB — CBC
HCT: 34.9 % — ABNORMAL LOW (ref 36.0–46.0)
Hemoglobin: 11.2 g/dL — ABNORMAL LOW (ref 12.0–15.0)
MCH: 28.1 pg (ref 26.0–34.0)
MCHC: 32.1 g/dL (ref 30.0–36.0)
MCV: 87.5 fL (ref 80.0–100.0)
Platelets: 313 10*3/uL (ref 150–400)
RBC: 3.99 MIL/uL (ref 3.87–5.11)
RDW: 13.9 % (ref 11.5–15.5)
WBC: 15.5 10*3/uL — ABNORMAL HIGH (ref 4.0–10.5)
nRBC: 0 % (ref 0.0–0.2)

## 2019-11-04 LAB — COMPREHENSIVE METABOLIC PANEL
ALT: 16 U/L (ref 0–44)
AST: 25 U/L (ref 15–41)
Albumin: 2.6 g/dL — ABNORMAL LOW (ref 3.5–5.0)
Alkaline Phosphatase: 124 U/L (ref 38–126)
Anion gap: 7 (ref 5–15)
BUN: 9 mg/dL (ref 6–20)
CO2: 24 mmol/L (ref 22–32)
Calcium: 8.8 mg/dL — ABNORMAL LOW (ref 8.9–10.3)
Chloride: 105 mmol/L (ref 98–111)
Creatinine, Ser: 0.75 mg/dL (ref 0.44–1.00)
GFR calc Af Amer: >60 mL/min (ref >60)
GFR calc non Af Amer: >60 mL/min (ref >60)
Glucose, Bld: 80 mg/dL (ref 70–99)
Potassium: 4.2 mmol/L (ref 3.5–5.1)
Sodium: 136 mmol/L (ref 135–145)
Total Bilirubin: 0.6 mg/dL (ref 0.3–1.2)
Total Protein: 5.8 g/dL — ABNORMAL LOW (ref 6.5–8.1)

## 2019-11-04 MED ORDER — LABETALOL HCL 200 MG PO TABS: 200.0000 mg | ORAL_TABLET | Freq: Two times a day (BID) | ORAL | 0 refills | Status: DC

## 2019-11-04 MED ORDER — SERTRALINE HCL 50 MG PO TABS
ORAL_TABLET | ORAL | 2 refills | Status: DC
Start: 2019-11-04 — End: 2019-12-31

## 2019-11-04 MED ORDER — LABETALOL HCL 200 MG PO TABS
200.0000 mg | ORAL_TABLET | Freq: Two times a day (BID) | ORAL | Status: DC
  Administered 2019-11-04 (×2): 200 mg via ORAL
  Filled 2019-11-04 (×2): qty 1

## 2019-11-04 NOTE — Progress Notes (Signed)
Liana Crocker CNM notified of B/P of 123/91 at 0345 and B/P of 133/91 at 0418. Pt. Is resting with eyes closed and denies c/o.

## 2019-11-04 NOTE — Progress Notes (Signed)
Pt discharged with infant. Discharge instructions, prescriptions, and follow up appointments given to and reviewed with patient. Pt verbalized understanding. Escorted out by staff. 

## 2019-11-04 NOTE — Progress Notes (Signed)
Reported to M. Eunice Blase CNM Pt. B/P's of 123/92 at 1912, 136/95 at 2306 and 131/100 at 0242. No other PIH s/s. Pt. Also denies pain. Order to recheck B/P at 0330 and 0400 received. M. To notify M. Eunice Blase CNM of Pt. B/P.'s after the 0400 reading is obtained.

## 2019-11-04 NOTE — Anesthesia Post-op Follow-up Note (Signed)
  Anesthesia Pain Follow-up Note  Patient: Kiara Franklin  Day #: 1  Date of Follow-up: 11/04/2019 Time: 3:06 PM  Last Vitals:  Vitals:   11/04/19 1141 11/04/19 1353  BP: 119/90 (!) 131/93  Pulse: (!) 102 79  Resp: 18   Temp: 37 C   SpO2: 100%     Level of Consciousness: alert  Pain: mild   Side Effects:Pruritis  Catheter Site Exam:clean, dry     Plan: D/C from anesthesia care at surgeon's request  Lenard Simmer

## 2019-11-04 NOTE — Progress Notes (Signed)
Pt. Teary and refusing BP at this time stating," Its just going to be higher, give me 10 minutes."

## 2019-11-04 NOTE — Lactation Note (Signed)
This note was copied from a baby's chart. Lactation Consultation Note  Patient Name: Kiara Franklin FEETO'L Date: 11/04/2019 Reason for consult: Follow-up assessment;Mother's request;Difficult latch;Primapara;Term;Other (Comment)(Flat nipples that were inverted on compression - has improve)   Maternal Data Formula Feeding for Exclusion: No Has patient been taught Hand Expression?: Yes Does the patient have breastfeeding experience prior to this delivery?: No  Feeding Feeding Type: Breast Fed  LATCH Score Latch: Repeated attempts needed to sustain latch, nipple held in mouth throughout feeding, stimulation needed to elicit sucking reflex.  Audible Swallowing: A few with stimulation  Type of Nipple: Flat  Comfort (Breast/Nipple): Filling, red/small blisters or bruises, mild/mod discomfort  Hold (Positioning): Assistance needed to correctly position infant at breast and maintain latch.  LATCH Score: 5  Interventions Interventions: Assisted with latch;Breast massage;Hand express;Pre-pump if needed;Reverse pressure;Breast compression;Adjust position;Support pillows;Position options;Coconut oil;Comfort gels  Lactation Tools Discussed/Used Tools: Coconut oil;Comfort gels;Nipple Shields Nipple shield size: 20 WIC Program: [employer])   Consult Status Consult Status: PRN Follow-up type: Call as needed    [name] 11/04/2019, 8:05 PM

## 2019-11-04 NOTE — Anesthesia Postprocedure Evaluation (Signed)
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  Procedure(s) Performed: AN AD HOC LABOR EPIDURAL  Patient location during evaluation: Mother Baby Anesthesia Type: Epidural Level of consciousness: awake and alert Pain management: pain level controlled Vital Signs Assessment: post-procedure vital signs reviewed and stable Respiratory status: spontaneous breathing, nonlabored ventilation and respiratory function stable Cardiovascular status: stable Postop Assessment: no headache, no backache, patient able to bend at knees, able to ambulate and adequate PO intake Anesthetic complications: no Comments: Patient did report some pruritis that resolved with removal of the epidural.     Last Vitals:  Vitals:   11/04/19 1141 11/04/19 1353  BP: 119/90 (!) 131/93  Pulse: (!) 102 79  Resp: 18   Temp: 37 C   SpO2: 100%     Last Pain:  Vitals:   11/04/19 1141  TempSrc: Oral  PainSc:                  Lenard Simmer

## 2019-11-04 NOTE — Lactation Note (Addendum)
This note was copied from a baby's chart. Lactation Consultation Note  Patient Name: Kiara Franklin RFFMB'W Date: 11/04/2019 Reason for consult: Follow-up assessment;Mother's request;Difficult latch;Primapara;Term;Other (Comment)(Flat nipples that were inverted on compression - has improve)  LC has been working with breast feeding since delivery.  Mom had flat nipples that inverted with compression.  Kiara Franklin had a slightly receding chin and overbite and a tendency to suck in lower lip. Could hand express a drop or two of colostrum to entice Kiara Franklin to latch.  Worked with Kiara Franklin to latch without nipple shield at first.  She was unable to sustain the latch slipping on and off the breast.  Mom had brought her own #24 nipple shields which were too big for mom's nipples and Kiara Franklin's mouth.  She was able to sustain the latch with the #20 nipple shield and lower lip remained flanged outward.  Colostrum was noted in nipple shield after feeding.  Continued to try without nipple shield, but would slip off the nipple or go to sleep.  Continued to do well with #20 nipple shield.  Mom could feel strong tug with nipple shield.  Through the night the FOB was giving her the #24 nipple shield and mom reported Kiara Franklin was pushing nipple shield away with tongue and fussing more.  Today when LC went in to assist with first feeding, we discovered she was using a #24 nipple shield.  Once we went back to #20 nipple shield, she was maintaining the latch longer and was more satisfied at the breast.  In the morning, she was more sleepy.  Later, she started waking for feedings and nursing for longer periods with #20 nipple shield.  Mom already had a Medela DEBP Maxflow from her [employer].  Demonstrated use to mom and reviewed pumping, collection, storage, labeling, cleaning and handling of expressed milk.  Mom pumped left breast while Kiara Franklin nursed from right breast for a few minutes for mom to get the feel of how it worked.  Mom did  not express any milk in that short time period.  For the last breast feed before mom was discharged, she fed well without nipple shield, sustaining the latch for 10 minutes which was progress.  She was also rooting with wide open mouth for a deeper latch.  Mom's nipples are tender, but no trauma noted.  Coconut oil and comfort gels given and instructed in alternating use.  Reviewed feeding cues, supply and demand, normal newborn stomach size, normal course of lactation and routine newborn feeding patterns.  Lactation Government social research officer given with contact numbers and reviewed and encouraged mom to call with any questions, concerns or assistance. Maternal Data Formula Feeding for Exclusion: No Has patient been taught Hand Expression?: Yes Does the patient have breastfeeding experience prior to this delivery?: No  Feeding Feeding Type: Breast Fed  LATCH Score Latch: Repeated attempts needed to sustain latch, nipple held in mouth throughout feeding, stimulation needed to elicit sucking reflex.  Audible Swallowing: A few with stimulation  Type of Nipple: Flat  Comfort (Breast/Nipple): Filling, red/small blisters or bruises, mild/mod discomfort  Hold (Positioning): Assistance needed to correctly position infant at breast and maintain latch.  LATCH Score: 5  Interventions Interventions: Assisted with latch;Breast massage;Hand express;Pre-pump if needed;Reverse pressure;Breast compression;Adjust position;Support pillows;Position options;Coconut oil;Comfort gels  Lactation Tools Discussed/Used Tools: Coconut oil;Comfort gels;Nipple Shields Nipple shield size: 20 WIC Program: [employer])   Consult Status Consult Status: PRN Follow-up type: Call as needed    [name]  11/04/2019, 7:29 PM

## 2019-11-04 NOTE — Progress Notes (Signed)
Post Partum Day 1 Subjective: no complaints, up ad lib, voiding, tolerating PO and and struggling wtih some breastfeeding challenges  Objective: Blood pressure (!) 131/93, pulse 79, temperature 98.6 F (37 C), temperature source Oral, resp. rate 18, height 5\' 4"  (1.626 m), weight 85.7 kg, last menstrual period 01/25/2019, SpO2 100 %, unknown if currently breastfeeding.  Physical Exam:  General: alert and cooperative  Breasts- soft, right Lochia: appropriate Uterine Fundus: firm Incision: healing well DVT Evaluation: No evidence of DVT seen on physical exam. Negative Homan's sign. No cords or calf tenderness.  Recent Labs    11/02/19 1030 11/04/19 0743  HGB 12.1 11.2*  HCT 36.0 34.9*    Assessment/Plan: Discharge home later this evening once BPs are WNL  Breastfeeding support provided by the CNM and Lactation consult requested Discussed her elevated BPS- starting on Labetalol 200 mg po BID. Will plan on f/u in 1 week from discharge at [facility] for BP check.    LOS: 2 days   [facility] 11/04/2019, 2:27 PM

## 2019-11-04 NOTE — Progress Notes (Signed)
B/P is 123/91. Pt. Resting with eyes closed. Denies c/o.

## 2019-11-04 NOTE — Final Progress Note (Signed)
Physician Final Progress Note  Patient ID: Kiara Franklin MRN: [id number] DOB/AGE: May 06, 1996 24 y.o.  Admit date: 11/02/2019 Admitting provider: Nadara Mustard, MD Discharge date: 11/04/2019   Admission Diagnoses: term pregnancy, chronic HTN affecting pregnancy at 39 weeks  Discharge Diagnoses:  Active Problems:   Indication for care in labor and delivery, antepartum   Chronic hypertension with exacerbation during pregnancy in third trimester   Anxiety, generalized    Consults: None  Significant Findings/ Diagnostic Studies: na  Procedures: induction of labor; normal vaginal  delivery  Discharge Condition: good  Disposition: Discharge disposition: 01-Home or Self Care       Diet: Regular diet  Discharge Activity: Activity as tolerated, No driving for 2 weeks and No sex for 6 weeks  Discharge Instructions    Activity as tolerated   Complete by: As directed    Call MD for:   Complete by: As directed    Call MD for:  difficulty breathing, headache or visual disturbances   Complete by: As directed    Call MD for:  extreme fatigue   Complete by: As directed    Call MD for:  hives   Complete by: As directed    Call MD for:  persistant dizziness or light-headedness   Complete by: As directed    Call MD for:  persistant nausea and vomiting   Complete by: As directed    Call MD for:  redness, tenderness, or signs of infection (pain, swelling, redness, odor or green/yellow discharge around incision site)   Complete by: As directed    Call MD for:  severe uncontrolled pain   Complete by: As directed    Call MD for:  temperature >100.4   Complete by: As directed    Diet - low sodium heart healthy   Complete by: As directed    Sexual activity   Complete by: As directed    Avoid intercourse for 6 weeks     Allergies as of 11/04/2019   No Known Allergies     Medication List    STOP taking these medications   meclizine 25 MG tablet Commonly known as: ANTIVERT     TAKE these medications   labetalol 200 MG tablet Commonly known as: NORMODYNE Take 1 tablet (200 mg total) by mouth 2 (two) times daily.   multivitamin-prenatal 27-0.8 MG Tabs tablet Take 1 tablet by mouth daily at 12 noon.   sertraline 50 MG tablet Commonly known as: Zoloft Take half a tablet by mouth every evening for 7 days, then take one tablet by mouth every evening.   triamcinolone cream 0.1 % Commonly known as: KENALOG Apply 1 application topically 2 (two) times daily.      Follow-up Information    Nadara Mustard, MD. Schedule an appointment as soon as possible for a visit in 6 week(s).   Specialty: Obstetrics and Gynecology Why: Call the office Monday and set up an appointment for later this week for a BP check, and make another appointment for a Postpartum physical in 6 weeks. Notify the office shouldyou plan on either an IUD or Nexplanon for the 6 week Postpartum visit. Contact information: [address] [place] [address] [phone]           Total time spent taking care of this patient: 60 minutes  Signed: Mirna Mires 11/04/2019, 4:46 PM

## 2019-11-05 ENCOUNTER — Encounter: Payer: BC Managed Care – PPO | Admitting: Obstetrics and Gynecology

## 2019-11-12 ENCOUNTER — Ambulatory Visit (INDEPENDENT_AMBULATORY_CARE_PROVIDER_SITE_OTHER): Payer: BC Managed Care – PPO | Admitting: Obstetrics & Gynecology

## 2019-11-12 ENCOUNTER — Encounter: Payer: Self-pay | Admitting: Obstetrics & Gynecology

## 2019-11-12 ENCOUNTER — Other Ambulatory Visit: Payer: Self-pay

## 2019-11-12 VITALS — BP 118/80 | Ht 64.0 in | Wt 162.0 lb

## 2019-11-12 DIAGNOSIS — O165 Unspecified maternal hypertension, complicating the puerperium: Secondary | ICD-10-CM | POA: Insufficient documentation

## 2019-11-12 HISTORY — DX: Unspecified maternal hypertension, complicating the puerperium: O16.5

## 2019-11-12 NOTE — Progress Notes (Signed)
Obstetrics & Gynecology Office Visit   Chief Complaint:  Chief Complaint  Patient presents with  . Blood Pressure Check    History of Present Illness: 24 y.o. G1P1001 being seen for follow up blood pressure check today.  The patient is post partum.  She has been on meds recently, not prior to this last pregnancy.The established diagnosis for the patient is gestational hypertension.  She is currently on labetalol 200mg .  She reports no current symptoms attributable to her blood pressure.  Medication list reviewed medications which may contribute to BP elevation were not noted.  Past Medical History:  Past Medical History:  Diagnosis Date  . Acne     Past Surgical History:  Past Surgical History:  Procedure Laterality Date  . KNEE SURGERY Left 11/2012    Gynecologic History: No LMP recorded.  Obstetric History: G1P1001  Family History:  Family History  Problem Relation Age of Onset  . Asthma Mother   . Healthy Father   . Thyroid disease Maternal Aunt   . Cancer Maternal Grandmother        unknown type  . COPD Neg Hx   . Diabetes Neg Hx   . Heart disease Neg Hx   . Stroke Neg Hx     Social History:  Social History   Socioeconomic History  . Marital status: Married    Spouse name: Not on file  . Number of children: Not on file  . Years of education: Not on file  . Highest education level: Not on file  Occupational History  . Not on file  Tobacco Use  . Smoking status: Never Smoker  . Smokeless tobacco: Never Used  Vaping Use  . Vaping Use: Never used  Substance and Sexual Activity  . Alcohol use: No  . Drug use: No  . Sexual activity: Yes    Birth control/protection: Pill  Other Topics Concern  . Not on file  Social History Narrative  . Not on file   Social Determinants of Health   Financial Resource Strain:   . Difficulty of Paying Living Expenses:   Food Insecurity:   . Worried About 12/2012 in the Last Year:   . Programme researcher, broadcasting/film/video in the Last Year:   Transportation Needs:   . Engineer, site (Medical):   Freight forwarder Lack of Transportation (Non-Medical):   Physical Activity:   . Days of Exercise per Week:   . Minutes of Exercise per Session:   Stress:   . Feeling of Stress :   Social Connections:   . Frequency of Communication with Friends and Family:   . Frequency of Social Gatherings with Friends and Family:   . Attends Religious Services:   . Active Member of Clubs or Organizations:   . Attends Marland Kitchen Meetings:   Banker Marital Status:   Intimate Partner Violence:   . Fear of Current or Ex-Partner:   . Emotionally Abused:   [employer] [employer] Physically Abused:   . Sexually Abused:     Allergies:  No Known Allergies  Medications: Prior to Admission medications   Medication Sig Start Date End Date Taking? Authorizing Provider  labetalol (NORMODYNE) 200 MG tablet Take 1 tablet (200 mg total) by mouth 2 (two) times daily. 11/04/19  Yes 01/04/20, CNM  Prenatal Vit-Fe Fumarate-FA (MULTIVITAMIN-PRENATAL) 27-0.8 MG TABS tablet Take 1 tablet by mouth daily at 12 noon.   Yes [provider]  sertraline (ZOLOFT) 50 MG tablet  Take half a tablet by mouth every evening for 7 days, then take one tablet by mouth every evening. 11/04/19  Yes Imagene Riches, CNM    Review of Systems  All other systems reviewed and are negative.   Physical Exam Blood pressure 118/80, height 5\' 4"  (1.626 m), weight 162 lb (73.5 kg), unknown if currently breastfeeding.  No LMP recorded.  General: NAD HEENT: normocephalic, anicteric Pulmonary: No increased work of breathing Cardiovascular: RRR, distal pulses 2+ Extremities: noedema, no erythema, no tenderness Neurologic: Grossly intact Psychiatric: mood appropriate, affect full  Assessment: 24 y.o. G1P1001 presenting for blood pressure evaluation today  Plan: Problem List Items Addressed This Visit      Cardiovascular and Mediastinum   Hypertension, postpartum  condition or complication - Primary      1) Blood pressure - blood pressure at today's visit is normotensive.  As a result will cont for now w Labetalol 200 mg BID, and plan to stop in a few weeks and prior to post partum appointment, to see if she needs prolonged therapy.  Likely not as was not on meds prior to pregnancy.. - additional blood work was not obtained   2) No s/sx PPD 3) Infant doing well 4) No BC desired at this time  Barnett Applebaum, MD, [facility], [facility] 11/12/2019  10:37 AM

## 2019-12-14 ENCOUNTER — Ambulatory Visit (INDEPENDENT_AMBULATORY_CARE_PROVIDER_SITE_OTHER): Payer: BC Managed Care – PPO | Admitting: Obstetrics & Gynecology

## 2019-12-14 ENCOUNTER — Other Ambulatory Visit: Payer: Self-pay

## 2019-12-14 ENCOUNTER — Encounter: Payer: Self-pay | Admitting: Obstetrics & Gynecology

## 2019-12-14 MED ORDER — MEDROXYPROGESTERONE ACETATE 150 MG/ML IM SUSP
150.0000 mg | INTRAMUSCULAR | 3 refills | Status: DC
Start: 2019-12-14 — End: 2020-06-03

## 2019-12-14 NOTE — Patient Instructions (Signed)
Thank you for choosing [facility]. As part of our ongoing efforts to improve patient experience, we would appreciate your feedback. Please fill out the short survey that you will receive by mail or MyChart. Your opinion is important to us! -Dr Tomy Khim  

## 2019-12-14 NOTE — Progress Notes (Signed)
  OBSTETRICS POSTPARTUM CLINIC PROGRESS NOTE  Subjective:     Kiara Franklin is a 24 y.o. G20P1001 female who presents for a postpartum visit. She is 6 weeks postpartum following a Term pregnancy or Induced labor and delivery by Vaginal, no problems at delivery.  I have fully reviewed the prenatal and intrapartum course. Anesthesia: epidural.  Postpartum course has been complicated by uncomplicated.  Baby is feeding by Breast.  Bleeding: patient has not  resumed menses.  Bowel function is normal. Bladder function is normal.  Patient is not sexually active. Contraception method desired is Depo-Provera injections.  Postpartum depression screening: negative. [place] 0.  The following portions of the patient's history were reviewed and updated as appropriate: allergies, current medications, past family history, past medical history, past social history, past surgical history and problem list.  Review of Systems Pertinent items are noted in HPI.  Objective:    BP 118/80   Ht 5\' 4"  (1.626 m)   Wt 165 lb (74.8 kg)   BMI 28.32 kg/m   General:  alert and no distress   Breasts:  inspection negative, no nipple discharge or bleeding, no masses or nodularity palpable  Lungs: clear to auscultation bilaterally  Heart:  regular rate and rhythm, S1, S2 normal, no murmur, click, rub or gallop  Abdomen: soft, non-tender; bowel sounds normal; no masses,  no organomegaly.     Vulva:  normal  Vagina: normal vagina, no discharge, exudate, lesion, or erythema  Cervix:  no cervical motion tenderness and no lesions  Corpus: normal size, contour, position, consistency, mobility, non-tender  Adnexa:  normal adnexa and no mass, fullness, tenderness  Rectal Exam: Not performed.          Assessment:  Post Partum Care visit 1. Postpartum care following vaginal delivery Plan Depo Infant doing well PAP due in Sept   Plan:  See orders and Patient Instructions Follow up in: 2 months or as needed.    03-19-1978, MD, [facility], [facility] 12/14/2019  3:04 PM

## 2019-12-17 ENCOUNTER — Other Ambulatory Visit: Payer: Self-pay

## 2019-12-17 ENCOUNTER — Ambulatory Visit (INDEPENDENT_AMBULATORY_CARE_PROVIDER_SITE_OTHER): Payer: Self-pay

## 2019-12-17 DIAGNOSIS — Z3042 Encounter for surveillance of injectable contraceptive: Secondary | ICD-10-CM

## 2019-12-17 MED ORDER — MEDROXYPROGESTERONE ACETATE 150 MG/ML IM SUSP
150.0000 mg | Freq: Once | INTRAMUSCULAR | Status: AC
  Administered 2019-12-17: 150 mg via INTRAMUSCULAR

## 2019-12-17 NOTE — Progress Notes (Signed)
Patient presents today for Depo Provera injection within dates. Given IM LD. Patient tolerated well.  

## 2019-12-23 ENCOUNTER — Encounter: Payer: Self-pay | Admitting: Family Medicine

## 2019-12-31 ENCOUNTER — Other Ambulatory Visit: Payer: Self-pay | Admitting: Obstetrics

## 2019-12-31 DIAGNOSIS — F411 Generalized anxiety disorder: Secondary | ICD-10-CM

## 2019-12-31 MED ORDER — SERTRALINE HCL 50 MG PO TABS: ORAL_TABLET | ORAL | 2 refills | Status: DC

## 2020-03-10 ENCOUNTER — Ambulatory Visit (INDEPENDENT_AMBULATORY_CARE_PROVIDER_SITE_OTHER): Payer: BC Managed Care – PPO

## 2020-03-10 ENCOUNTER — Other Ambulatory Visit: Payer: Self-pay

## 2020-03-10 DIAGNOSIS — Z3042 Encounter for surveillance of injectable contraceptive: Secondary | ICD-10-CM

## 2020-03-10 MED ORDER — MEDROXYPROGESTERONE ACETATE 150 MG/ML IM SUSP
150.0000 mg | Freq: Once | INTRAMUSCULAR | Status: AC
  Administered 2020-03-10: 150 mg via INTRAMUSCULAR

## 2020-05-01 ENCOUNTER — Ambulatory Visit
Admission: EM | Admit: 2020-05-01 | Discharge: 2020-05-01 | Disposition: A | Discharge status: Home / Self Care | Payer: No Typology Code available for payment source | Attending: Physician Assistant | Admitting: Physician Assistant

## 2020-05-01 ENCOUNTER — Encounter: Payer: Self-pay | Admitting: Emergency Medicine

## 2020-05-01 ENCOUNTER — Other Ambulatory Visit: Payer: Self-pay

## 2020-05-01 DIAGNOSIS — Z793 Long term (current) use of hormonal contraceptives: Secondary | ICD-10-CM | POA: Diagnosis not present

## 2020-05-01 DIAGNOSIS — Z20822 Contact with and (suspected) exposure to covid-19: Secondary | ICD-10-CM | POA: Insufficient documentation

## 2020-05-01 DIAGNOSIS — N61 Mastitis without abscess: Secondary | ICD-10-CM

## 2020-05-01 DIAGNOSIS — R509 Fever, unspecified: Secondary | ICD-10-CM | POA: Diagnosis not present

## 2020-05-01 DIAGNOSIS — R059 Cough, unspecified: Secondary | ICD-10-CM | POA: Diagnosis not present

## 2020-05-01 DIAGNOSIS — N644 Mastodynia: Secondary | ICD-10-CM

## 2020-05-01 DIAGNOSIS — J029 Acute pharyngitis, unspecified: Secondary | ICD-10-CM

## 2020-05-01 DIAGNOSIS — Z79899 Other long term (current) drug therapy: Secondary | ICD-10-CM | POA: Diagnosis not present

## 2020-05-01 LAB — GROUP A STREP BY PCR: Group A Strep by PCR: NOT DETECTED

## 2020-05-01 LAB — RESP PANEL BY RT-PCR (FLU A&B, COVID) ARPGX2
Influenza A by PCR: NEGATIVE
Influenza B by PCR: NEGATIVE
SARS Coronavirus 2 by RT PCR: NEGATIVE

## 2020-05-01 MED ORDER — CEPHALEXIN 500 MG PO CAPS: 1000.0000 mg | ORAL_CAPSULE | Freq: Two times a day (BID) | ORAL | 0 refills | Status: AC

## 2020-05-01 MED ORDER — CEFTRIAXONE SODIUM 1 G IJ SOLR
1.0000 g | Freq: Once | INTRAMUSCULAR | Status: AC
  Administered 2020-05-01: 1 g via INTRAMUSCULAR

## 2020-05-01 MED ORDER — ACETAMINOPHEN 500 MG PO TABS
1000.0000 mg | ORAL_TABLET | Freq: Once | ORAL | Status: AC
  Administered 2020-05-01: 1000 mg via ORAL

## 2020-05-01 NOTE — Discharge Instructions (Signed)
You have an infection of your breast.  You have been given an injection of an antibiotic today.  Start the Keflex as prescribed to treat the infection.  Also use warm compresses and continue to breast-feed.  You can take Tylenol as needed for pain relief.  Your strep test and flu as well as Covid testing were all negative today.  Go to emergency department if you have continued fevers after 2-3 more days, worsening cough, pain in your chest, difficulty breathing or pain on breathing, increased breast swelling or pain.  Follow-up with either your PCP or OB/gynecologist next week for recheck.

## 2020-05-01 NOTE — ED Provider Notes (Signed)
[facility]    CSN: [id number] Arrival date & time: 05/01/20  1857      History   Chief Complaint Chief Complaint  Patient presents with  . Breast Pain    left  . Fever    HPI Kiara Franklin is a 24 y.o. female presenting for onset of left-sided breast tenderness and fever today.  Patient is currently breast-feeding.  She states that her breast is also red.  Patient has not taken anything for her fever.  She says that she has continued to breast-feed and that does not relieve the pain somewhat.  She denies any known exposure to flu or Covid.  She does also report that today she started to have a cough, nasal congestion and sore throat.  Also admits to headaches.  Denies any breathing difficulty, abdominal pain, nausea/vomiting, diarrhea or changes in smell and taste.  Patient states she is fully vaccinated for COVID-19.  Patient denies any other complaints or concerns at this time.  HPI  Past Medical History:  Diagnosis Date  . Acne     Patient Active Problem List   Diagnosis Date Noted  . Hypertension, postpartum condition or complication 11/12/2019  . Anxiety, generalized 11/04/2019  . Indication for care in labor and delivery, antepartum 11/02/2019  . Chronic hypertension with exacerbation during pregnancy in third trimester 11/02/2019  . Supervision of high risk pregnancy, antepartum 03/28/2019  . Chronic hypertension affecting pregnancy 03/23/2019    Past Surgical History:  Procedure Laterality Date  . KNEE SURGERY Left 11/2012    OB History    Gravida  1   Para  1   Term  1   Preterm  0   AB  0   Living  1     SAB  0   TAB  0   Ectopic  0   Multiple  0   Live Births  1            Home Medications    Prior to Admission medications   Medication Sig Start Date End Date Taking? Authorizing Provider  medroxyPROGESTERone (DEPO-PROVERA) 150 MG/ML injection Inject 1 mL (150 mg total) into the muscle every 3 (three) months. 12/14/19   Yes Nadara Mustard, MD  Prenatal Vit-Fe Fumarate-FA (MULTIVITAMIN-PRENATAL) 27-0.8 MG TABS tablet Take 1 tablet by mouth daily at 12 noon.    Yes [provider]  cephALEXin (KEFLEX) 500 MG capsule Take 2 capsules (1,000 mg total) by mouth 2 (two) times daily for 10 days. 05/01/20 05/11/20  Eusebio Friendly B, PA-C  labetalol (NORMODYNE) 200 MG tablet Take 1 tablet (200 mg total) by mouth 2 (two) times daily. Patient not taking: Reported on 12/14/2019 11/04/19   Mirna Mires, CNM  sertraline (ZOLOFT) 50 MG tablet Take half a tablet by mouth every evening for 7 days, then take one tablet by mouth every evening. 12/31/19   Mirna Mires, CNM    Family History Family History  Problem Relation Age of Onset  . Asthma Mother   . Healthy Father   . Thyroid disease Maternal Aunt   . Cancer Maternal Grandmother        unknown type  . COPD Neg Hx   . Diabetes Neg Hx   . Heart disease Neg Hx   . Stroke Neg Hx     Social History Social History   Tobacco Use  . Smoking status: Never Smoker  . Smokeless tobacco: Never Used  Vaping Use  . Vaping  Use: Never used  Substance Use Topics  . Alcohol use: No  . Drug use: No     Allergies   Patient has no known allergies.   Review of Systems Review of Systems  Constitutional: Positive for fatigue and fever. Negative for chills and diaphoresis.  HENT: Positive for congestion, rhinorrhea and sore throat. Negative for ear pain, sinus pressure and sinus pain.   Respiratory: Positive for cough. Negative for shortness of breath.   Gastrointestinal: Negative for abdominal pain, nausea and vomiting.  Musculoskeletal: Negative for arthralgias and myalgias.  Skin: Negative for rash.       Left breast pain and redness   Neurological: Positive for headaches. Negative for weakness.  Hematological: Negative for adenopathy.     Physical Exam Triage Vital Signs ED Triage Vitals  Enc Vitals Group     BP 05/01/20 1912 (!) 138/94      Pulse Rate 05/01/20 1912 (!) 139     Resp 05/01/20 1912 18     Temp 05/01/20 1912 (!) 101.9 F (38.8 C)     Temp Source 05/01/20 1912 Oral     SpO2 05/01/20 1912 100 %     Weight 05/01/20 1910 164 lb 14.5 oz (74.8 kg)     Height 05/01/20 1910 5\' 4"  (1.626 m)     Head Circumference --      Peak Flow --      Pain Score 05/01/20 1909 5     Pain Loc --      Pain Edu? --      Excl. in GC? --    No data found.  Updated Vital Signs BP (!) 138/94 (BP Location: Right Arm)   Pulse (!) 129   Temp (!) 101.1 F (38.4 C) (Oral)   Resp 18   Ht 5\' 4"  (1.626 m)   Wt 164 lb 14.5 oz (74.8 kg)   SpO2 100%   Breastfeeding Yes   BMI 28.31 kg/m    Physical Exam Vitals and nursing note reviewed.  Constitutional:      General: She is not in acute distress.    Appearance: Normal appearance. She is not ill-appearing or toxic-appearing.  HENT:     Head: Normocephalic and atraumatic.     Nose: Rhinorrhea present.     Mouth/Throat:     Mouth: Mucous membranes are moist.     Pharynx: Oropharynx is clear. Posterior oropharyngeal erythema present.  Eyes:     General: No scleral icterus.       Right eye: No discharge.        Left eye: No discharge.     Conjunctiva/sclera: Conjunctivae normal.  Cardiovascular:     Rate and Rhythm: Regular rhythm. Tachycardia present.     Heart sounds: Normal heart sounds.  Pulmonary:     Effort: Pulmonary effort is normal. No respiratory distress.     Breath sounds: Normal breath sounds.  Chest:     Breasts:        Right: Normal.        Left: Skin change (3 cm x 2 cm area of erythema and induration w/o fluctuance ~ 9-10 o'clock as depicted below. Area is tender) and tenderness present. No nipple discharge.    Musculoskeletal:     Cervical back: Neck supple.  Skin:    General: Skin is dry.  Neurological:     General: No focal deficit present.     Mental Status: She is alert. Mental status is at baseline.  Motor: No weakness.     Gait: Gait normal.    Psychiatric:        Mood and Affect: Mood normal.        Behavior: Behavior normal.        Thought Content: Thought content normal.      UC Treatments / Results  Labs (all labs ordered are listed, but only abnormal results are displayed) Labs Reviewed  GROUP A STREP BY PCR  RESP PANEL BY RT-PCR (FLU A&B, COVID) [id number]    EKG   Radiology No results found.  Procedures Procedures (including critical care time)  Medications Ordered in UC Medications  cefTRIAXone (ROCEPHIN) injection 1 g (has no administration in time range)  acetaminophen (TYLENOL) tablet 1,000 mg (1,000 mg Oral Given 05/01/20 1929)    Initial Impression / Assessment and Plan / UC Course  I have reviewed the triage vital signs and the nursing notes.  Pertinent labs & imaging results that were available during my care of the patient were reviewed by me and considered in my medical decision making (see chart for details).   24 year old female presenting for sudden onset of fever, fatigue, left breast pain, runny nose, cough and sore throat today.  Temperature in the clinic is 101.9 F.  Pulse elevated at 139 bpm.  On exam, she has mild posterior pharyngeal erythema.  Mild nasal congestion.  She also has an area of induration, erythema and tenderness of the left breast.  Molecular strep test and respiratory panel obtained today due to symptoms of fever, runny nose, cough and sore throat.  Molecular strep test and respiratory panel all negative.  Discussed results with patient.  Recheck of temperature his at 101.1 F.  Heart rate had elevated to 165 bpm.  Patient admitted to being extremely anxious about possibly having Covid.  Shortly after I told her that her Covid test was negative her heart rate decreased to 129.  Patient states that "I was have a high heart rate and high blood pressure."  Treating for mastitis at this time with 1 g of Rocephin given her vital signs and sent home with Keflex.  Strict ED  precautions discussed with patient.  Final Clinical Impressions(s) / UC Diagnoses   Final diagnoses:  Acute mastitis of left breast  Fever, unspecified  Breast pain, left  Cough  Sore throat     Discharge Instructions     You have an infection of your breast.  You have been given an injection of an antibiotic today.  Start the Keflex as prescribed to treat the infection.  Also use warm compresses and continue to breast-feed.  You can take Tylenol as needed for pain relief.  Your strep test and flu as well as Covid testing were all negative today.  Go to emergency department if you have continued fevers after 2-3 more days, worsening cough, pain in your chest, difficulty breathing or pain on breathing, increased breast swelling or pain.  Follow-up with either your PCP or OB/gynecologist next week for recheck.    ED Prescriptions    Medication Sig Dispense Auth. Provider   cephALEXin (KEFLEX) 500 MG capsule Take 2 capsules (1,000 mg total) by mouth 2 (two) times daily for 10 days. 40 capsule Shirlee Latch, PA-C     PDMP not reviewed this encounter.   Shirlee Latch, PA-C 05/01/20 2025

## 2020-05-01 NOTE — ED Triage Notes (Signed)
Pt c/o left breast pain, and fever. She states it started today. She is currently breast feeding. She states the breast is also red.

## 2020-06-02 ENCOUNTER — Telehealth: Payer: Self-pay | Admitting: Obstetrics & Gynecology

## 2020-06-02 ENCOUNTER — Ambulatory Visit: Payer: Self-pay

## 2020-06-02 NOTE — Telephone Encounter (Signed)
Sch ANnual, which is needed before we can consider refills

## 2020-06-02 NOTE — Telephone Encounter (Signed)
Patient is scheduled for her annual on 06/03/2020 with Dr Jean Rosenthal

## 2020-06-03 ENCOUNTER — Encounter: Payer: BC Managed Care – PPO | Admitting: Obstetrics and Gynecology

## 2020-06-03 ENCOUNTER — Ambulatory Visit: Payer: BC Managed Care – PPO

## 2020-06-03 ENCOUNTER — Other Ambulatory Visit: Payer: Self-pay

## 2020-06-03 MED ORDER — MEDROXYPROGESTERONE ACETATE 150 MG/ML IM SUSP: 150.0000 mg | INTRAMUSCULAR | 3 refills | Status: DC

## 2020-06-04 ENCOUNTER — Ambulatory Visit: Payer: BC Managed Care – PPO

## 2020-08-26 ENCOUNTER — Other Ambulatory Visit: Payer: Self-pay

## 2020-08-26 ENCOUNTER — Ambulatory Visit (INDEPENDENT_AMBULATORY_CARE_PROVIDER_SITE_OTHER): Payer: No Typology Code available for payment source

## 2020-08-26 DIAGNOSIS — Z3042 Encounter for surveillance of injectable contraceptive: Secondary | ICD-10-CM

## 2020-08-26 MED ORDER — MEDROXYPROGESTERONE ACETATE 150 MG/ML IM SUSP
150.0000 mg | Freq: Once | INTRAMUSCULAR | Status: AC
  Administered 2020-08-26: 150 mg via INTRAMUSCULAR

## 2020-08-26 NOTE — Progress Notes (Signed)
Patient presents today for Depo Provera injection within dates. Given IM Right Deltoid per patient request. Patient tolerated well. 

## 2020-11-18 ENCOUNTER — Ambulatory Visit (INDEPENDENT_AMBULATORY_CARE_PROVIDER_SITE_OTHER): Payer: No Typology Code available for payment source

## 2020-11-18 ENCOUNTER — Other Ambulatory Visit: Payer: Self-pay

## 2020-11-18 DIAGNOSIS — Z3042 Encounter for surveillance of injectable contraceptive: Secondary | ICD-10-CM | POA: Diagnosis not present

## 2020-11-18 MED ORDER — MEDROXYPROGESTERONE ACETATE 150 MG/ML IM SUSP
150.0000 mg | Freq: Once | INTRAMUSCULAR | Status: AC
  Administered 2020-11-18: 150 mg via INTRAMUSCULAR

## 2020-11-18 NOTE — Progress Notes (Signed)
Pt here for depo which was given IM left deltoid.  NDC# [id number].  Pt states she has stopped breastfeeding after a year and has been bleeding since.  States she did not bleed until she stopped breastfeeding.  Adv bleeding probably from stopping breastfeeding.  Pt not sure she wants to continue with depo.  Maybe wants an IUD but not sure.  Sugg she schedule a bc conf when she checks out as well as her 12wk depo appt; if decides on a diff bc and csl depo appt.

## 2020-11-24 NOTE — Progress Notes (Signed)
BP 139/80   Pulse 90   Temp 97.6 F (36.4 C) (Oral)   Ht 5' 4.4" (1.636 m)   Wt 158 lb 12.8 oz (72 kg)   LMP  (LMP Unknown)   SpO2 99%   Breastfeeding No   BMI 26.92 kg/m    Subjective:    Patient ID: Kiara Franklin, female    DOB: 02-Apr-1996, 25 y.o.   MRN: [id number]  HPI: Kiara Franklin is a 25 y.o. female presenting on 11/25/2020 for comprehensive medical examination. Current medical complaints include:none  She currently lives with: Menopausal Symptoms: no  HYPERTENSION Hypertension status: controlled  Satisfied with current treatment?  No current treatment Duration of hypertension: years BP monitoring frequency:  a few times a month BP range: 120/80s BP medication side effects:  no Medication compliance: excellent compliance Previous BP meds:labetalol Aspirin: no Recurrent headaches: no Visual changes: no Palpitations: no Dyspnea: no Chest pain: no Lower extremity edema: no Dizzy/lightheaded: no  ANXIETY Well controlled on current medication regimen.  Depression Screen done today and results listed below:  Depression screen [facility] 11/25/2020 01/30/2019 01/25/2017  Decreased Interest 0 0 0  Down, Depressed, Hopeless 0 0 0  PHQ - 2 Score 0 0 0  Altered sleeping - 0 -  Tired, decreased energy - 0 -  Change in appetite - 0 -  Feeling bad or failure about yourself  - 0 -  Trouble concentrating - 0 -  Moving slowly or fidgety/restless - 0 -  Suicidal thoughts - 0 -  PHQ-9 Score - 0 -    The patient does not have a history of falls. I did complete a risk assessment for falls. A plan of care for falls was documented.   Past Medical History:  Past Medical History:  Diagnosis Date   Acne     Surgical History:  Past Surgical History:  Procedure Laterality Date   KNEE SURGERY Left 11/2012    Medications:  Current Outpatient Medications on File Prior to Visit  Medication Sig   medroxyPROGESTERone (DEPO-PROVERA) 150 MG/ML injection Inject 1 mL (150 mg  total) into the muscle every 3 (three) months.   Prenatal Vit-Fe Fumarate-FA (MULTIVITAMIN-PRENATAL) 27-0.8 MG TABS tablet Take 1 tablet by mouth daily at 12 noon.    No current facility-administered medications on file prior to visit.    Allergies:  No Known Allergies  Social History:  Social History   Socioeconomic History   Marital status: Married    Spouse name: Not on file   Number of children: Not on file   Years of education: Not on file   Highest education level: Not on file  Occupational History   Not on file  Tobacco Use   Smoking status: Never   Smokeless tobacco: Never  Vaping Use   Vaping Use: Never used  Substance and Sexual Activity   Alcohol use: No   Drug use: No   Sexual activity: Yes    Birth control/protection: None, Injection  Other Topics Concern   Not on file  Social History Narrative   Not on file   Social Determinants of Health   Financial Resource Strain: Not on file  Food Insecurity: Not on file  Transportation Needs: Not on file  Physical Activity: Not on file  Stress: Not on file  Social Connections: Not on file  Intimate Partner Violence: Not on file   Social History   Tobacco Use  Smoking Status Never  Smokeless Tobacco Never   Social History  Substance and Sexual Activity  Alcohol Use No    Family History:  Family History  Problem Relation Age of Onset   Asthma Mother    Healthy Father    Thyroid disease Maternal Aunt    Cancer Maternal Grandmother        unknown type   COPD Neg Hx    Diabetes Neg Hx    Heart disease Neg Hx    Stroke Neg Hx     Past medical history, surgical history, medications, allergies, family history and social history reviewed with patient today and changes made to appropriate areas of the chart.   Review of Systems  Eyes:  Negative for blurred vision and double vision.  Respiratory:  Negative for shortness of breath.   Cardiovascular:  Negative for chest pain, palpitations and leg  swelling.  Neurological:  Negative for dizziness and headaches.  Psychiatric/Behavioral:  The patient is not nervous/anxious.   All other ROS negative except what is listed above and in the HPI.      Objective:    BP 139/80   Pulse 90   Temp 97.6 F (36.4 C) (Oral)   Ht 5' 4.4" (1.636 m)   Wt 158 lb 12.8 oz (72 kg)   LMP  (LMP Unknown)   SpO2 99%   Breastfeeding No   BMI 26.92 kg/m   Wt Readings from Last 3 Encounters:  11/25/20 158 lb 12.8 oz (72 kg)  06/03/20 158 lb (71.7 kg)  05/01/20 164 lb 14.5 oz (74.8 kg)    Physical Exam Vitals and nursing note reviewed.  Constitutional:      General: She is awake. She is not in acute distress.    Appearance: She is well-developed. She is not ill-appearing.  HENT:     Head: Normocephalic and atraumatic.     Right Ear: Hearing, tympanic membrane, ear canal and external ear normal. No drainage.     Left Ear: Hearing, tympanic membrane, ear canal and external ear normal. No drainage.     Nose: Nose normal.     Right Sinus: No maxillary sinus tenderness or frontal sinus tenderness.     Left Sinus: No maxillary sinus tenderness or frontal sinus tenderness.     Mouth/Throat:     Mouth: Mucous membranes are moist.     Pharynx: Oropharynx is clear. Uvula midline. No pharyngeal swelling, oropharyngeal exudate or posterior oropharyngeal erythema.  Eyes:     General: Lids are normal.        Right eye: No discharge.        Left eye: No discharge.     Extraocular Movements: Extraocular movements intact.     Conjunctiva/sclera: Conjunctivae normal.     Pupils: Pupils are equal, round, and reactive to light.     Visual Fields: Right eye visual fields normal and left eye visual fields normal.  Neck:     Thyroid: No thyromegaly.     Vascular: No carotid bruit.     Trachea: Trachea normal.  Cardiovascular:     Rate and Rhythm: Normal rate and regular rhythm.     Heart sounds: Normal heart sounds. No murmur heard.   No gallop.  Pulmonary:      Effort: Pulmonary effort is normal. No accessory muscle usage or respiratory distress.     Breath sounds: Normal breath sounds.  Chest:  Breasts:    Right: Normal. No axillary adenopathy or supraclavicular adenopathy.     Left: Normal. No axillary adenopathy or supraclavicular adenopathy.  Abdominal:  General: Bowel sounds are normal.     Palpations: Abdomen is soft. There is no hepatomegaly or splenomegaly.     Tenderness: There is no abdominal tenderness.  Musculoskeletal:        General: Normal range of motion.     Cervical back: Normal range of motion and neck supple.     Right lower leg: No edema.     Left lower leg: No edema.  Lymphadenopathy:     Head:     Right side of head: No submental, submandibular, tonsillar, preauricular or posterior auricular adenopathy.     Left side of head: No submental, submandibular, tonsillar, preauricular or posterior auricular adenopathy.     Cervical: No cervical adenopathy.     Upper Body:     Right upper body: No supraclavicular, axillary or pectoral adenopathy.     Left upper body: No supraclavicular, axillary or pectoral adenopathy.  Skin:    General: Skin is warm and dry.     Capillary Refill: Capillary refill takes less than 2 seconds.     Findings: No rash.  Neurological:     Mental Status: She is alert and oriented to person, place, and time.     Cranial Nerves: Cranial nerves are intact.     Gait: Gait is intact.     Deep Tendon Reflexes: Reflexes are normal and symmetric.     Reflex Scores:      Brachioradialis reflexes are 2+ on the right side and 2+ on the left side.      Patellar reflexes are 2+ on the right side and 2+ on the left side. Psychiatric:        Attention and Perception: Attention normal.        Mood and Affect: Mood normal.        Speech: Speech normal.        Behavior: Behavior normal. Behavior is cooperative.        Thought Content: Thought content normal.        Judgment: Judgment normal.     Results for orders placed or performed during the hospital encounter of 05/01/20  Group A Strep by PCR   Specimen: Throat; Sterile Swab  Result Value Ref Range   Group A Strep by PCR NOT DETECTED NOT DETECTED  Resp Panel by RT-PCR (Flu A&B, Covid) Throat   Specimen: Throat; Nasopharyngeal(NP) swabs in vial transport medium  Result Value Ref Range   SARS Coronavirus 2 by RT PCR NEGATIVE NEGATIVE   Influenza A by PCR NEGATIVE NEGATIVE   Influenza B by PCR NEGATIVE NEGATIVE      Assessment & Plan:   Problem List Items Addressed This Visit       Cardiovascular and Mediastinum   Hypertension, postpartum condition or complication    Chronic.  Controlled.  Continue with current medication regimen.  Labs ordered today.  Return to clinic in 6 months for reevaluation.  Call sooner if concerns arise.           Other   Anxiety, generalized    Chronic.  Controlled without medication. Labs ordered today.  Return to clinic in 6 months for reevaluation.  Call sooner if concerns arise.         Other Visit Diagnoses     Annual physical exam    -  Primary   Health maintenance reviewed today. Labs ordered.  HPV given.  PAP up to date.    Relevant Orders   CBC with Differential/Platelet   Comprehensive metabolic  panel   Lipid panel   TSH   Urinalysis, Routine w reflex microscopic   Encounter for hepatitis C screening test for low risk patient       Relevant Orders   Hepatitis C Antibody        Follow up plan: Return in about 1 month (around 12/25/2020) for BP Check.   LABORATORY TESTING:  - Pap smear: up to date  IMMUNIZATIONS:   - Tdap: Tetanus vaccination status reviewed: last tetanus booster within 10 years. - Influenza: Postponed to flu season - Pneumovax: Not applicable - Prevnar: Not applicable - HPV: Administered today - Zostavax vaccine: Not applicable  SCREENING: -Mammogram: Not applicable  - Colonoscopy: Not applicable  - Bone Density: Not applicable   -Hearing Test: Not applicable  -Spirometry: Not applicable  PATIENT COUNSELING:   Advised to take 1 mg of folate supplement per day if capable of pregnancy.   Sexuality: Discussed sexually transmitted diseases, partner selection, use of condoms, avoidance of unintended pregnancy  and contraceptive alternatives.   Advised to avoid cigarette smoking.  I discussed with the patient that most people either abstain from alcohol or drink within safe limits (<=14/week and <=4 drinks/occasion for males, <=7/weeks and <= 3 drinks/occasion for females) and that the risk for alcohol disorders and other health effects rises proportionally with the number of drinks per week and how often a drinker exceeds daily limits.  Discussed cessation/primary prevention of drug use and availability of treatment for abuse.   Diet: Encouraged to adjust caloric intake to maintain  or achieve ideal body weight, to reduce intake of dietary saturated fat and total fat, to limit sodium intake by avoiding high sodium foods and not adding table salt, and to maintain adequate dietary potassium and calcium preferably from fresh fruits, vegetables, and low-fat dairy products.    stressed the importance of regular exercise  Injury prevention: Discussed safety belts, safety helmets, smoke detector, smoking near bedding or upholstery.   Dental health: Discussed importance of regular tooth brushing, flossing, and dental visits.    NEXT PREVENTATIVE PHYSICAL DUE IN 1 YEAR. Return in about 1 month (around 12/25/2020) for BP Check.

## 2020-11-25 ENCOUNTER — Ambulatory Visit (INDEPENDENT_AMBULATORY_CARE_PROVIDER_SITE_OTHER): Payer: No Typology Code available for payment source | Admitting: Nurse Practitioner

## 2020-11-25 ENCOUNTER — Encounter: Payer: Self-pay | Admitting: Nurse Practitioner

## 2020-11-25 ENCOUNTER — Other Ambulatory Visit: Payer: Self-pay

## 2020-11-25 VITALS — BP 139/80 | HR 90 | Temp 97.6°F | Ht 64.4 in | Wt 158.8 lb

## 2020-11-25 DIAGNOSIS — Z1159 Encounter for screening for other viral diseases: Secondary | ICD-10-CM | POA: Diagnosis not present

## 2020-11-25 DIAGNOSIS — O165 Unspecified maternal hypertension, complicating the puerperium: Secondary | ICD-10-CM | POA: Diagnosis not present

## 2020-11-25 DIAGNOSIS — F411 Generalized anxiety disorder: Secondary | ICD-10-CM

## 2020-11-25 DIAGNOSIS — Z23 Encounter for immunization: Secondary | ICD-10-CM | POA: Diagnosis not present

## 2020-11-25 DIAGNOSIS — Z0000 Encounter for general adult medical examination without abnormal findings: Secondary | ICD-10-CM

## 2020-11-25 LAB — URINALYSIS, ROUTINE W REFLEX MICROSCOPIC
Bilirubin, UA: NEGATIVE
Glucose, UA: NEGATIVE
Ketones, UA: NEGATIVE
Leukocytes,UA: NEGATIVE
Nitrite, UA: NEGATIVE
Protein,UA: NEGATIVE
Specific Gravity, UA: 1.025 (ref 1.005–1.030)
Urobilinogen, Ur: 0.2 mg/dL (ref 0.2–1.0)
pH, UA: 5.5 (ref 5.0–7.5)

## 2020-11-25 LAB — MICROSCOPIC EXAMINATION
Bacteria, UA: NONE SEEN
WBC, UA: NONE SEEN /hpf (ref 0–5)

## 2020-11-25 NOTE — Assessment & Plan Note (Signed)
Chronic.  Controlled.  Continue with current medication regimen.  Labs ordered today.  Return to clinic in 6 months for reevaluation.  Call sooner if concerns arise.  ? ?

## 2020-11-25 NOTE — Assessment & Plan Note (Signed)
Chronic.  Controlled without medication..  Labs ordered today.  Return to clinic in 6 months for reevaluation.  Call sooner if concerns arise.  ° °

## 2020-11-25 NOTE — Addendum Note (Signed)
Addended by: Pablo Ledger on: 11/25/2020 11:44 AM   Modules accepted: Orders

## 2020-11-26 LAB — COMPREHENSIVE METABOLIC PANEL
ALT: 12 IU/L (ref 0–32)
AST: 11 IU/L (ref 0–40)
Albumin/Globulin Ratio: 2 (ref 1.2–2.2)
Albumin: 4.8 g/dL (ref 3.9–5.0)
Alkaline Phosphatase: 75 IU/L (ref 44–121)
BUN/Creatinine Ratio: 26 — ABNORMAL HIGH (ref 9–23)
BUN: 21 mg/dL — ABNORMAL HIGH (ref 6–20)
Bilirubin Total: 0.4 mg/dL (ref 0.0–1.2)
CO2: 18 mmol/L — ABNORMAL LOW (ref 20–29)
Calcium: 9.6 mg/dL (ref 8.7–10.2)
Chloride: 103 mmol/L (ref 96–106)
Creatinine, Ser: 0.81 mg/dL (ref 0.57–1.00)
Globulin, Total: 2.4 g/dL (ref 1.5–4.5)
Glucose: 80 mg/dL (ref 65–99)
Potassium: 4.5 mmol/L (ref 3.5–5.2)
Sodium: 139 mmol/L (ref 134–144)
Total Protein: 7.2 g/dL (ref 6.0–8.5)
eGFR: 103 mL/min/{1.73_m2} (ref >59)

## 2020-11-26 LAB — CBC WITH DIFFERENTIAL/PLATELET
Basophils Absolute: 0 10*3/uL (ref 0.0–0.2)
Basos: 0 %
EOS (ABSOLUTE): 0.1 10*3/uL (ref 0.0–0.4)
Eos: 1 %
Hematocrit: 43.1 % (ref 34.0–46.6)
Hemoglobin: 14 g/dL (ref 11.1–15.9)
Immature Grans (Abs): 0 10*3/uL (ref 0.0–0.1)
Immature Granulocytes: 0 %
Lymphocytes Absolute: 2.5 10*3/uL (ref 0.7–3.1)
Lymphs: 29 %
MCH: 28 pg (ref 26.6–33.0)
MCHC: 32.5 g/dL (ref 31.5–35.7)
MCV: 86 fL (ref 79–97)
Monocytes Absolute: 0.7 10*3/uL (ref 0.1–0.9)
Monocytes: 8 %
Neutrophils Absolute: 5.2 10*3/uL (ref 1.4–7.0)
Neutrophils: 62 %
Platelets: 429 10*3/uL (ref 150–450)
RBC: 5 x10E6/uL (ref 3.77–5.28)
RDW: 13.3 % (ref 11.7–15.4)
WBC: 8.5 10*3/uL (ref 3.4–10.8)

## 2020-11-26 LAB — HEPATITIS C ANTIBODY: Hep C Virus Ab: <0.1 s/co ratio (ref 0.0–0.9)

## 2020-11-26 LAB — LIPID PANEL
Chol/HDL Ratio: 3.9 ratio (ref 0.0–4.4)
Cholesterol, Total: 157 mg/dL (ref 100–199)
HDL: 40 mg/dL (ref >39)
LDL Chol Calc (NIH): 102 mg/dL — ABNORMAL HIGH (ref 0–99)
Triglycerides: 76 mg/dL (ref 0–149)
VLDL Cholesterol Cal: 15 mg/dL (ref 5–40)

## 2020-11-26 LAB — TSH: TSH: 1.08 u[IU]/mL (ref 0.450–4.500)

## 2020-11-26 NOTE — Progress Notes (Signed)
Hi Kiara Franklin. It was nice to meet you.  Your lab work shows that your Hepatitis C, complete blood count, and thyroid are normal.  The "trace RBCs" means there is a small amount of blood in your urine which is likely because you are bleeding.  Your kidney function looks like you were a little dehydrated. We will monitor this in the future.   Your Cholesterol is slightly elevated.   Recommend following a low fat diet.  We will reevaluate this at future visits.   Follow up as discussed.

## 2020-12-25 ENCOUNTER — Ambulatory Visit: Payer: No Typology Code available for payment source | Admitting: Nurse Practitioner

## 2021-02-10 ENCOUNTER — Ambulatory Visit (INDEPENDENT_AMBULATORY_CARE_PROVIDER_SITE_OTHER): Payer: No Typology Code available for payment source

## 2021-02-10 ENCOUNTER — Other Ambulatory Visit: Payer: Self-pay

## 2021-02-10 DIAGNOSIS — Z3042 Encounter for surveillance of injectable contraceptive: Secondary | ICD-10-CM | POA: Diagnosis not present

## 2021-02-10 MED ORDER — MEDROXYPROGESTERONE ACETATE 150 MG/ML IM SUSP
150.0000 mg | Freq: Once | INTRAMUSCULAR | Status: AC
  Administered 2021-02-10: 150 mg via INTRAMUSCULAR

## 2021-02-10 NOTE — Progress Notes (Signed)
Pt here for depo which was given IM right deltoid.  NDC# 66993-370-83 

## 2021-02-24 ENCOUNTER — Ambulatory Visit: Payer: Self-pay | Admitting: *Deleted

## 2021-02-24 NOTE — Telephone Encounter (Signed)
Reason for Disposition  [1] MILD-MODERATE pain AND [2] constant AND [3] present > 2 hours  Answer Assessment - Initial Assessment Questions 1. LOCATION: "Where does it hurt?"      Lower abdominal pain- tender- left 2. RADIATION: "Does the pain shoot anywhere else?" (e.g., chest, back)     no 3. ONSET: "When did the pain begin?" (e.g., minutes, hours or days ago)      Yesterday evening 4. SUDDEN: "Gradual or sudden onset?"     sudden 5. PATTERN "Does the pain come and go, or is it constant?"    - If constant: "Is it getting better, staying the same, or worsening?"      (Note: Constant means the pain never goes away completely; most serious pain is constant and it progresses)     - If intermittent: "How long does it last?" "Do you have pain now?"     (Note: Intermittent means the pain goes away completely between bouts)     Sitting it is better- constant when standing/moving 6. SEVERITY: "How bad is the pain?"  (e.g., Scale 1-10; mild, moderate, or severe)   - MILD (1-3): doesn't interfere with normal activities, abdomen soft and not tender to touch    - MODERATE (4-7): interferes with normal activities or awakens from sleep, abdomen tender to touch    - SEVERE (8-10): excruciating pain, doubled over, unable to do any normal activities      moderate 7. RECURRENT SYMPTOM: "Have you ever had this type of stomach pain before?" If Yes, ask: "When was the last time?" and "What happened that time?"      no 8. CAUSE: "What do you think is causing the stomach pain?"     unsure 9. RELIEVING/AGGRAVATING FACTORS: "What makes it better or worse?" (e.g., movement, antacids, bowel movement)    Not really 10. OTHER SYMPTOMS: "Do you have any other symptoms?" (e.g., back pain, diarrhea, fever, urination pain, vomiting)       no 11. PREGNANCY: "Is there any chance you are pregnant?" "When was your last menstrual period?"       N/a- UPT- negative-Depo shot  Protocols used: Abdominal Pain -  [facility]

## 2021-02-24 NOTE — Telephone Encounter (Signed)
Routing to provider as an FYI

## 2021-02-24 NOTE — Telephone Encounter (Signed)
Patient calling to report LLQ abdominal pain that started last night and has continued throughout today- pain rated as moderate.

## 2021-02-26 ENCOUNTER — Encounter: Payer: Self-pay | Admitting: Obstetrics & Gynecology

## 2021-02-26 ENCOUNTER — Ambulatory Visit (INDEPENDENT_AMBULATORY_CARE_PROVIDER_SITE_OTHER): Payer: No Typology Code available for payment source | Admitting: Obstetrics & Gynecology

## 2021-02-26 ENCOUNTER — Other Ambulatory Visit (HOSPITAL_COMMUNITY)
Admission: RE | Admit: 2021-02-26 | Discharge: 2021-02-26 | Disposition: A | Discharge status: Home / Self Care | Payer: No Typology Code available for payment source | Source: Ambulatory Visit | Attending: Obstetrics & Gynecology | Admitting: Obstetrics & Gynecology

## 2021-02-26 ENCOUNTER — Other Ambulatory Visit: Payer: Self-pay

## 2021-02-26 ENCOUNTER — Ambulatory Visit
Admission: RE | Admit: 2021-02-26 | Discharge: 2021-02-26 | Disposition: A | Discharge status: Home / Self Care | Payer: No Typology Code available for payment source | Source: Ambulatory Visit | Attending: Obstetrics & Gynecology | Admitting: Obstetrics & Gynecology

## 2021-02-26 VITALS — BP 120/80 | Ht 65.0 in | Wt 163.0 lb

## 2021-02-26 DIAGNOSIS — R1032 Left lower quadrant pain: Secondary | ICD-10-CM | POA: Insufficient documentation

## 2021-02-26 DIAGNOSIS — Z124 Encounter for screening for malignant neoplasm of cervix: Secondary | ICD-10-CM

## 2021-02-26 NOTE — Patient Instructions (Signed)
Transvaginal Ultrasound A transvaginal ultrasound, also called an endovaginal ultrasound, is a test that uses sound waves to take pictures of the female genital tract. The pictures are taken with a device, called a transducer, that is placed in the vagina. This test may be done to: Check for problems with your pregnancy. Check your developing baby. Check for anything abnormal in the uterus or ovaries. Find out why you have pelvic pain or bleeding. Tell a health care provider about: Any allergies you have. All medicines you are taking, including vitamins, herbs, eye drops, creams, and over-the-counter medicines. Any bleeding problems you have. Any surgeries you have had. Any medical conditions you have. Whether you are pregnant or may be pregnant. Whether you are having your menstrual period. What are the risks? This is a safe procedure. There are no known risks or complications of having this test. What happens before the procedure? This procedure needs to be done when your bladder is empty. Follow your health care provider's instructions about drinking fluids and emptying your bladder before the test. What happens during the procedure?  You will empty your bladder before the procedure. You will undress from the waist down. You will put on a gown or a wrap to cover yourself while you get ready for the exam. You will lie on your back on an exam table. You will place your feet into foot rests (stirrups). A drape will be placed over your abdomen and your legs. A health care provider will cover the transducer with a germ-free (sterile) cover. A gel will be put on the transducer. The gel helps transmit the sound waves and prevents irritation of your vagina. The health care provider will insert the transducer into your vagina to get images. These will be displayed on a monitor that looks like a small television screen. The transducer will be removed when the procedure is complete. The procedure  may vary among health care providers and hospitals. What can I expect after the procedure? It is up to you to get the results of your procedure. Ask your health care provider, or the department that is doing the procedure, when your results will be ready. Keep all follow-up visits. This is important. Summary A transvaginal ultrasound, also called an endovaginal ultrasound, is a test that uses sound waves to take pictures of the female genital tract. This is a safe procedure. There are no known risks associated with this test. The procedure needs to be done when your bladder is empty. Follow your health care provider's instructions about drinking fluids and emptying your bladder before the test. During the procedure, you will undress from the waist down and lie down on an exam table. A health care provider will insert a transducer into your vagina to obtain images. Ask your health care provider, or the department that is doing the procedure, when your results will be ready. This information is not intended to replace advice given to you by your health care provider. Make sure you discuss any questions you have with your health care provider. Document Revised: 08/15/2020 Document Reviewed: 08/15/2020 Elsevier Patient Education  2022 [employer].

## 2021-02-26 NOTE — Progress Notes (Signed)
Gynecology Pelvic Pain Evaluation   Chief Complaint  Patient presents with   Abdominal Pain    History of Present Illness:   Patient is a 25 y.o. G1P1001 who LMP was No LMP recorded. (Depo), presents today for a problem visit.  She complains of pain.   Her pain is localized to the LLQ area, described as constant, began  3 days ago  and its severity is described as moderate. The pain radiates to the  Non-radiating. She has these associated symptoms which include pressure when voids. Patient has these modifiers which include relaxation that make it better and  being active  that make it worse.  Context includes: She has been on Depo since childbirth 15 mos ago, no periods; no prior GYN surgery or dx; no recent infection or trauma.  She does report mild and intermittent deep dyspareunia since delivery.  PMHx: She  has a past medical history of Acne. Also,  has a past surgical history that includes Knee surgery (Left, 11/2012)., family history includes Asthma in her mother; Cancer in her maternal grandmother; Healthy in her father; Thyroid disease in her maternal aunt.,  reports that she has never smoked. She has never used smokeless tobacco. She reports that she does not drink alcohol and does not use drugs.  She has a current medication list which includes the following prescription(s): medroxyprogesterone and multivitamin-prenatal. Also, has No Known Allergies.  Review of Systems  Constitutional:  Negative for chills, fever and malaise/fatigue.  HENT:  Negative for congestion, sinus pain and sore throat.   Eyes:  Negative for blurred vision and pain.  Respiratory:  Negative for cough and wheezing.   Cardiovascular:  Negative for chest pain and leg swelling.  Gastrointestinal:  Negative for abdominal pain, constipation, diarrhea, heartburn, nausea and vomiting.  Genitourinary:  Negative for dysuria, frequency, hematuria and urgency.  Musculoskeletal:  Negative for back pain, joint pain,  myalgias and neck pain.  Skin:  Negative for itching and rash.  Neurological:  Negative for dizziness, tremors and weakness.  Endo/Heme/Allergies:  Does not bruise/bleed easily.  Psychiatric/Behavioral:  Negative for depression. The patient is not nervous/anxious and does not have insomnia.    Objective: BP 120/80   Ht 5\' 5"  (1.651 m)   Wt 163 lb (73.9 kg)   BMI 27.12 kg/m  Physical Exam Constitutional:      General: She is not in acute distress.    Appearance: She is well-developed.  Genitourinary:     Right Labia: No rash or tenderness.    Left Labia: No tenderness or rash.    No vaginal erythema or bleeding.      Right Adnexa: not tender and no mass present.    Left Adnexa: tender.    Left Adnexa: no mass present.    No cervical motion tenderness, discharge, polyp or nabothian cyst.     Uterus is not enlarged.     No uterine mass detected.    Uterus is midaxial.     Pelvic exam was performed with patient in the lithotomy position.  HENT:     Head: Normocephalic and atraumatic.     Nose: Nose normal.  Abdominal:     General: There is no distension.     Palpations: Abdomen is soft.     Tenderness: There is no abdominal tenderness.  Musculoskeletal:        General: Normal range of motion.  Neurological:     Mental Status: She is alert and oriented to person, place,  and time.     Cranial Nerves: No cranial nerve deficit.  Skin:    General: Skin is warm and dry.  Psychiatric:        Attention and Perception: Attention normal.        Mood and Affect: Mood and affect normal.        Speech: Speech normal.        Behavior: Behavior normal.        Thought Content: Thought content normal.        Judgment: Judgment normal.    Female chaperone present for pelvic portion of the physical exam  Assessment: 25 y.o. G1P1001  1. LLQ pain - Cyst vs other etiology discussed - US PELVIC COMPLETE WITH TRANSVAGINAL; Future (ordered stat) - If cyst, and based on characteristics,  will decide on management options (discussed surgery rationale)  - If pain persists and [place] is normal, then will consider CT - As she reports some improvement today, will first monitor to see if resolves on its own (prior to CT or referral)  2. Screening for malignant neoplasm of cervix, STD - Cytology - PAP, also STD testing - Risks of PID low  3. Continue Depo for contraception  Annamarie Major, MD, [facility], [facility] 02/26/2021  10:33 AM

## 2021-03-02 LAB — CYTOLOGY - PAP
Chlamydia: NEGATIVE
Comment: NEGATIVE
Comment: NORMAL
Diagnosis: NEGATIVE
Neisseria Gonorrhea: NEGATIVE

## 2021-04-03 ENCOUNTER — Ambulatory Visit: Payer: No Typology Code available for payment source | Admitting: Obstetrics and Gynecology

## 2021-05-04 ENCOUNTER — Other Ambulatory Visit: Payer: Self-pay | Admitting: Obstetrics and Gynecology

## 2021-05-05 ENCOUNTER — Other Ambulatory Visit: Payer: Self-pay

## 2021-05-05 ENCOUNTER — Ambulatory Visit (INDEPENDENT_AMBULATORY_CARE_PROVIDER_SITE_OTHER): Payer: No Typology Code available for payment source

## 2021-05-05 ENCOUNTER — Encounter: Payer: Self-pay | Admitting: Obstetrics & Gynecology

## 2021-05-05 DIAGNOSIS — Z3042 Encounter for surveillance of injectable contraceptive: Secondary | ICD-10-CM

## 2021-05-05 MED ORDER — MEDROXYPROGESTERONE ACETATE 150 MG/ML IM SUSP
150.0000 mg | Freq: Once | INTRAMUSCULAR | Status: AC
  Administered 2021-05-05: 150 mg via INTRAMUSCULAR

## 2021-05-05 MED ORDER — MEDROXYPROGESTERONE ACETATE 150 MG/ML IM SUSP: 150.0000 mg | INTRAMUSCULAR | 3 refills | Status: DC

## 2021-05-05 NOTE — Progress Notes (Signed)
Pt here for depo which was given IM right deltoid.  Pt tolerated well.  NDC# [phone]

## 2021-08-03 ENCOUNTER — Other Ambulatory Visit: Payer: Self-pay

## 2021-08-03 ENCOUNTER — Encounter: Payer: Self-pay | Admitting: Obstetrics & Gynecology

## 2021-08-03 ENCOUNTER — Ambulatory Visit (INDEPENDENT_AMBULATORY_CARE_PROVIDER_SITE_OTHER): Payer: No Typology Code available for payment source

## 2021-08-03 DIAGNOSIS — Z3042 Encounter for surveillance of injectable contraceptive: Secondary | ICD-10-CM | POA: Diagnosis not present

## 2021-08-03 MED ORDER — MEDROXYPROGESTERONE ACETATE 150 MG/ML IM SUSP
150.0000 mg | Freq: Once | INTRAMUSCULAR | Status: AC
  Administered 2021-08-03: 150 mg via INTRAMUSCULAR

## 2021-08-03 NOTE — Progress Notes (Signed)
Patient presents today for Depo Provera injection within dates. Given IM Left Deltoid per patient request. Patient tolerated well. 

## 2021-10-12 ENCOUNTER — Encounter: Payer: Self-pay | Admitting: Nurse Practitioner

## 2021-10-12 DIAGNOSIS — E7800 Pure hypercholesterolemia, unspecified: Secondary | ICD-10-CM | POA: Insufficient documentation

## 2021-10-12 NOTE — Telephone Encounter (Signed)
Returned patients call and appt was made for May 17 at 3:20pm ?

## 2021-10-13 NOTE — Telephone Encounter (Signed)
Noted  

## 2021-10-14 ENCOUNTER — Encounter: Payer: Self-pay | Admitting: Nurse Practitioner

## 2021-10-14 ENCOUNTER — Ambulatory Visit (INDEPENDENT_AMBULATORY_CARE_PROVIDER_SITE_OTHER): Payer: No Typology Code available for payment source | Admitting: Nurse Practitioner

## 2021-10-14 DIAGNOSIS — E663 Overweight: Secondary | ICD-10-CM | POA: Diagnosis not present

## 2021-10-14 MED ORDER — SEMAGLUTIDE-WEIGHT MANAGEMENT 2.4 MG/0.75ML ~~LOC~~ SOAJ: 2.4000 mg | SUBCUTANEOUS | 0 refills | Status: AC

## 2021-10-14 MED ORDER — SEMAGLUTIDE-WEIGHT MANAGEMENT 0.25 MG/0.5ML ~~LOC~~ SOAJ: 0.2500 mg | SUBCUTANEOUS | 0 refills | Status: DC

## 2021-10-14 MED ORDER — SEMAGLUTIDE-WEIGHT MANAGEMENT 1 MG/0.5ML ~~LOC~~ SOAJ: 1.0000 mg | SUBCUTANEOUS | 0 refills | Status: AC

## 2021-10-14 MED ORDER — SEMAGLUTIDE-WEIGHT MANAGEMENT 1.7 MG/0.75ML ~~LOC~~ SOAJ: 1.7000 mg | SUBCUTANEOUS | 0 refills | Status: AC

## 2021-10-14 MED ORDER — SEMAGLUTIDE-WEIGHT MANAGEMENT 0.5 MG/0.5ML ~~LOC~~ SOAJ
0.5000 mg | SUBCUTANEOUS | 0 refills | Status: DC
Start: 2021-11-12 — End: 2021-11-26

## 2021-10-14 NOTE — Progress Notes (Signed)
BP 130/72   Pulse 92   Temp 98.4 F (36.9 C) (Oral)   Ht 5\' 5"  (1.651 m)   Wt 164 lb 12.8 oz (74.8 kg)   SpO2 99%   BMI 27.42 kg/m    Subjective:    Patient ID: Kiara Franklin, female    DOB: 09/09/1995, 26 y.o.   MRN: [id number]  HPI: Kiara Franklin is a 26 y.o. female  Chief Complaint  Patient presents with   Weight Loss    Patient would like to start taking Wegovy   WEIGHT GAIN Duration: years Previous attempts at weight loss: yes Complications of obesity: HTN Peak weight:  Weight loss goal: 140 lb Weight loss to date: 6 months Requesting obesity pharmacotherapy: yes Current weight loss supplements/medications: yes Previous weight loss supplements/meds: no Calories:  Staying in the 160-165lb range.  Has been doing weight loss.  States the last 25lbs has been very difficult to get off.   Relevant past medical, surgical, family and social history reviewed and updated as indicated. Interim medical history since our last visit reviewed. Allergies and medications reviewed and updated.  Review of Systems  Constitutional:  Positive for appetite change and unexpected weight change.   Per HPI unless specifically indicated above     Objective:    BP 130/72   Pulse 92   Temp 98.4 F (36.9 C) (Oral)   Ht 5\' 5"  (1.651 m)   Wt 164 lb 12.8 oz (74.8 kg)   SpO2 99%   BMI 27.42 kg/m   Wt Readings from Last 3 Encounters:  10/14/21 164 lb 12.8 oz (74.8 kg)  02/26/21 163 lb (73.9 kg)  11/25/20 158 lb 12.8 oz (72 kg)    Physical Exam Vitals and nursing note reviewed.  Constitutional:      General: She is not in acute distress.    Appearance: Normal appearance. She is normal weight. She is not ill-appearing, toxic-appearing or diaphoretic.  HENT:     Head: Normocephalic.     Right Ear: External ear normal.     Left Ear: External ear normal.     Nose: Nose normal.     Mouth/Throat:     Mouth: Mucous membranes are moist.     Pharynx: Oropharynx is clear.  Eyes:      General:        Right eye: No discharge.        Left eye: No discharge.     Extraocular Movements: Extraocular movements intact.     Conjunctiva/sclera: Conjunctivae normal.     Pupils: Pupils are equal, round, and reactive to light.  Cardiovascular:     Rate and Rhythm: Normal rate and regular rhythm.     Heart sounds: No murmur heard. Pulmonary:     Effort: Pulmonary effort is normal. No respiratory distress.     Breath sounds: Normal breath sounds. No wheezing or rales.  Musculoskeletal:     Cervical back: Normal range of motion and neck supple.  Skin:    General: Skin is warm and dry.     Capillary Refill: Capillary refill takes less than 2 seconds.  Neurological:     General: No focal deficit present.     Mental Status: She is alert and oriented to person, place, and time. Mental status is at baseline.  Psychiatric:        Mood and Affect: Mood normal.        Behavior: Behavior normal.        Thought Content: Thought  content normal.        Judgment: Judgment normal.    Results for orders placed or performed in visit on 02/26/21  Cytology - PAP  Result Value Ref Range   Neisseria Gonorrhea Negative    Chlamydia Negative    Adequacy      Satisfactory for evaluation; transformation zone component PRESENT.   Diagnosis      - Negative for intraepithelial lesion or malignancy (NILM)   Comment Normal Reference Ranger Chlamydia - Negative    Comment      Normal Reference Range Neisseria Gonorrhea - Negative      Assessment & Plan:   Problem List Items Addressed This Visit       Other   Overweight (BMI 25.0-29.9)    Chronic.  Would like to lose 20-25lbs. Has tried [employer] and exercise without success.  Will start Wegovy.  Discussed with patient how to properly use medication.  Discussed side effects and benefits of medications.  Discussed how to titrate medication.  Best used with diet and exercise.  Patient understands and agrees with the plan of care.  Follow up in 6 weeks  for reevaluation.         Follow up plan: Return in about 6 weeks (around 11/25/2021) for Medication Management.

## 2021-10-15 ENCOUNTER — Encounter: Payer: Self-pay | Admitting: Nurse Practitioner

## 2021-10-15 ENCOUNTER — Telehealth: Payer: Self-pay

## 2021-10-15 DIAGNOSIS — E663 Overweight: Secondary | ICD-10-CM | POA: Insufficient documentation

## 2021-10-15 NOTE — Assessment & Plan Note (Signed)
Chronic.  Would like to lose 20-25lbs. Has tried [employer] and exercise without success.  Will start Wegovy.  Discussed with patient how to properly use medication.  Discussed side effects and benefits of medications.  Discussed how to titrate medication.  Best used with diet and exercise.  Patient understands and agrees with the plan of care.  Follow up in 6 weeks for reevaluation.

## 2021-10-15 NOTE — Telephone Encounter (Addendum)
PA has been initiated through covermymeds for [facility]. Awaiting determination.   Key: [id number]

## 2021-10-19 ENCOUNTER — Encounter: Payer: Self-pay | Admitting: Nurse Practitioner

## 2021-10-19 ENCOUNTER — Telehealth: Payer: Self-pay

## 2021-10-19 NOTE — Telephone Encounter (Signed)
PA for wegovy has been denied, pt did not meet the guideline rules for the requested drug, is there an alternative?

## 2021-10-19 NOTE — Telephone Encounter (Signed)
Contacted patient via mychart

## 2021-11-02 ENCOUNTER — Ambulatory Visit (INDEPENDENT_AMBULATORY_CARE_PROVIDER_SITE_OTHER): Payer: No Typology Code available for payment source

## 2021-11-02 ENCOUNTER — Telehealth: Payer: Self-pay

## 2021-11-02 DIAGNOSIS — Z3042 Encounter for surveillance of injectable contraceptive: Secondary | ICD-10-CM

## 2021-11-02 MED ORDER — MEDROXYPROGESTERONE ACETATE 150 MG/ML IM SUSP
150.0000 mg | Freq: Once | INTRAMUSCULAR | Status: AC
  Administered 2021-11-02: 150 mg via INTRAMUSCULAR

## 2021-11-02 NOTE — Progress Notes (Signed)
Patient presents today for Depo Provera injection within dates. Given IM Right Deltoid. Patient tolerated well. 

## 2021-11-02 NOTE — Telephone Encounter (Signed)
PA initiated for [facility]  Key: [facility] Waiting on determination.

## 2021-11-04 ENCOUNTER — Other Ambulatory Visit: Payer: Self-pay

## 2021-11-04 MED ORDER — SEMAGLUTIDE-WEIGHT MANAGEMENT 0.25 MG/0.5ML ~~LOC~~ SOAJ: 0.2500 mg | SUBCUTANEOUS | 0 refills | Status: DC

## 2021-11-04 MED ORDER — SEMAGLUTIDE-WEIGHT MANAGEMENT 0.25 MG/0.5ML ~~LOC~~ SOAJ: 0.2500 mg | SUBCUTANEOUS | 0 refills | Status: AC

## 2021-11-11 ENCOUNTER — Telehealth: Payer: Self-pay | Admitting: Nurse Practitioner

## 2021-11-11 ENCOUNTER — Ambulatory Visit: Payer: Self-pay

## 2021-11-11 NOTE — Telephone Encounter (Signed)
Patient called her mail order pharmacy and they contacted [place] regarding her Semaglutide-Weight Management 0.25 MG/0.5ML SOAJ.     Preferred Pharmacy (with phone number or street name):  [facility]) [place], [place] - [address] Phone:  [phone]  Fax:  [phone]       Insurance states they will cover a 3 month supply and she currently only has a one month supply. Please assist patient further

## 2021-11-11 NOTE — Telephone Encounter (Signed)
Pt called, she had her sister do injection today and the liquid didn't go into skin and was coming out of the needle when pulled away. She is asking if she can come in to do injection or we teach her how. I advised her I will send message back to see if she can schedule nurse visit and bring in pen with her. She does injections on Wednesdays. Pt is ok with Mychart message or CB.   Summary: IRJJOA Injection   Patient states she administered her wegovy injection for the 2nd time today and each time the medicine leaks out and she is not getting the full dosage. Patient inquiring if she can have the injection administered in office or recommendations on how to administer the entire dosage. Patient states she is standing up when administering.   Please follow up w/ patient      Reason for Disposition  [1] Caller has NON-URGENT medicine question about med that PCP prescribed AND [2] triager unable to answer question  Answer Assessment - Initial Assessment Questions 1. NAME of MEDICATION: "What medicine are you calling about?"     Wegovy 2. QUESTION: "What is your question?" (e.g., double dose of medicine, side effect)     Needing to get help doing the injection 3. PRESCRIBING HCP: "Who prescribed it?" Reason: if prescribed by specialist, call should be referred to that group.     Clydie Braun, NP  Protocols used: Medication Question Call-A-AH

## 2021-11-11 NOTE — Telephone Encounter (Signed)
Returned call to [employer], representative states they will reach out to patient and notify mail order is for 90 days. They will recommend she get medication locally.

## 2021-11-11 NOTE — Telephone Encounter (Signed)
Shawn w/ [place] states pt's insurance will only cover a 90 day supply of Semaglutide-Weight Management 0.25 MG/0.5ML SOAJ  Can you send in a 90 day supply?  [facility]) [place], [place]

## 2021-11-11 NOTE — Telephone Encounter (Signed)
Patient already picked up medication at [facility] pharmacy.

## 2021-11-11 NOTE — Telephone Encounter (Signed)
Patient only has a one month supply because she is going to increase her dose in 1 month.

## 2021-11-12 NOTE — Telephone Encounter (Signed)
Patient is scheduled for an appointment next wednesday

## 2021-11-18 ENCOUNTER — Ambulatory Visit: Payer: No Typology Code available for payment source

## 2021-11-18 NOTE — Progress Notes (Signed)
Patient presented to the office today for assistance with [facility] injection. Instructed patient on how to inject the medication. Assisted patient with injection. Patient tolerated well with no complaints.

## 2021-11-26 ENCOUNTER — Encounter: Payer: Self-pay | Admitting: Nurse Practitioner

## 2021-11-26 ENCOUNTER — Ambulatory Visit (INDEPENDENT_AMBULATORY_CARE_PROVIDER_SITE_OTHER): Payer: No Typology Code available for payment source | Admitting: Nurse Practitioner

## 2021-11-26 VITALS — BP 133/87 | HR 79 | Temp 98.2°F | Ht 64.0 in | Wt 157.2 lb

## 2021-11-26 DIAGNOSIS — Z136 Encounter for screening for cardiovascular disorders: Secondary | ICD-10-CM | POA: Diagnosis not present

## 2021-11-26 DIAGNOSIS — Z23 Encounter for immunization: Secondary | ICD-10-CM | POA: Diagnosis not present

## 2021-11-26 DIAGNOSIS — R8290 Unspecified abnormal findings in urine: Secondary | ICD-10-CM | POA: Diagnosis not present

## 2021-11-26 DIAGNOSIS — Z0000 Encounter for general adult medical examination without abnormal findings: Secondary | ICD-10-CM | POA: Diagnosis not present

## 2021-11-26 MED ORDER — SEMAGLUTIDE-WEIGHT MANAGEMENT 0.5 MG/0.5ML ~~LOC~~ SOAJ
0.5000 mg | SUBCUTANEOUS | 0 refills | Status: AC
Start: 2021-11-26 — End: 2021-12-24

## 2021-11-26 NOTE — Progress Notes (Signed)
BP 133/87   Pulse 79   Temp 98.2 F (36.8 C) (Oral)   Ht 5\' 4"  (1.626 m)   Wt 157 lb 3.2 oz (71.3 kg)   LMP  (LMP Unknown)   SpO2 99%   BMI 26.98 kg/m    Subjective:    Patient ID: , female    DOB: August 05, 1995, 26 y.o.   MRN: [id number]  HPI: Kiara Franklin is a 26 y.o. female presenting on 11/26/2021 for comprehensive medical examination. Current medical complaints include:none  She currently lives with: Menopausal Symptoms: no  Depression Screen done today and results listed below:     11/26/2021    3:44 PM 10/14/2021    3:59 PM 11/25/2020    9:31 AM 01/30/2019    8:42 AM 01/25/2017    8:10 AM  Depression screen PHQ 2/9  Decreased Interest 0 0 0 0 0  Down, Depressed, Hopeless 0 0 0 0 0  PHQ - 2 Score 0 0 0 0 0  Altered sleeping 0 0  0   Tired, decreased energy 0 0  0   Change in appetite 0 0  0   Feeling bad or failure about yourself  0 0  0   Trouble concentrating 0 0  0   Moving slowly or fidgety/restless 0 0  0   Suicidal thoughts 0 0  0   PHQ-9 Score 0 0  0   Difficult doing work/chores Not difficult at all Not difficult at all       The patient does not have a history of falls. I did complete a risk assessment for falls. A plan of care for falls was documented.   Past Medical History:  Past Medical History:  Diagnosis Date   Acne     Surgical History:  Past Surgical History:  Procedure Laterality Date   KNEE SURGERY Left 11/2012    Medications:  Current Outpatient Medications on File Prior to Visit  Medication Sig   medroxyPROGESTERone (DEPO-PROVERA) 150 MG/ML injection Inject 1 mL (150 mg total) into the muscle every 3 (three) months.   Semaglutide-Weight Management 0.25 MG/0.5ML SOAJ Inject 0.25 mg into the skin once a week for 28 days.   [START ON 12/11/2021] Semaglutide-Weight Management 1 MG/0.5ML SOAJ Inject 1 mg into the skin once a week for 28 days.   [START ON 01/09/2022] Semaglutide-Weight Management 1.7 MG/0.75ML SOAJ Inject 1.7  mg into the skin once a week for 28 days.   [START ON 02/07/2022] Semaglutide-Weight Management 2.4 MG/0.75ML SOAJ Inject 2.4 mg into the skin once a week for 28 days.   No current facility-administered medications on file prior to visit.    Allergies:  No Known Allergies  Social History:  Social History   Socioeconomic History   Marital status: Married    Spouse name: Not on file   Number of children: Not on file   Years of education: Not on file   Highest education level: Not on file  Occupational History   Not on file  Tobacco Use   Smoking status: Never   Smokeless tobacco: Never  Vaping Use   Vaping Use: Never used  Substance and Sexual Activity   Alcohol use: No   Drug use: No   Sexual activity: Yes    Birth control/protection: None, Injection  Other Topics Concern   Not on file  Social History Narrative   Not on file   Social Determinants of Health   Financial Resource Strain: Not on file  Food Insecurity: Not on file  Transportation Needs: Not on file  Physical Activity: Not on file  Stress: Not on file  Social Connections: Not on file  Intimate Partner Violence: Not on file   Social History   Tobacco Use  Smoking Status Never  Smokeless Tobacco Never   Social History   Substance and Sexual Activity  Alcohol Use No    Family History:  Family History  Problem Relation Age of Onset   Asthma Mother    Healthy Father    Thyroid disease Maternal Aunt    Cancer Maternal Grandmother        unknown type   COPD Neg Hx    Diabetes Neg Hx    Heart disease Neg Hx    Stroke Neg Hx     Past medical history, surgical history, medications, allergies, family history and social history reviewed with patient today and changes made to appropriate areas of the chart.   Review of Systems  All other systems reviewed and are negative.  All other ROS negative except what is listed above and in the HPI.      Objective:    BP 133/87   Pulse 79   Temp  98.2 F (36.8 C) (Oral)   Ht 5\' 4"  (1.626 m)   Wt 157 lb 3.2 oz (71.3 kg)   LMP  (LMP Unknown)   SpO2 99%   BMI 26.98 kg/m   Wt Readings from Last 3 Encounters:  11/26/21 157 lb 3.2 oz (71.3 kg)  10/14/21 164 lb 12.8 oz (74.8 kg)  02/26/21 163 lb (73.9 kg)    Physical Exam Vitals and nursing note reviewed.  Constitutional:      General: She is awake. She is not in acute distress.    Appearance: Normal appearance. She is well-developed and normal weight. She is not ill-appearing.  HENT:     Head: Normocephalic and atraumatic.     Right Ear: Hearing, tympanic membrane, ear canal and external ear normal. No drainage.     Left Ear: Hearing, tympanic membrane, ear canal and external ear normal. No drainage.     Nose: Nose normal.     Right Sinus: No maxillary sinus tenderness or frontal sinus tenderness.     Left Sinus: No maxillary sinus tenderness or frontal sinus tenderness.     Mouth/Throat:     Mouth: Mucous membranes are moist.     Pharynx: Oropharynx is clear. Uvula midline. No pharyngeal swelling, oropharyngeal exudate or posterior oropharyngeal erythema.  Eyes:     General: Lids are normal.        Right eye: No discharge.        Left eye: No discharge.     Extraocular Movements: Extraocular movements intact.     Conjunctiva/sclera: Conjunctivae normal.     Pupils: Pupils are equal, round, and reactive to light.     Visual Fields: Right eye visual fields normal and left eye visual fields normal.  Neck:     Thyroid: No thyromegaly.     Vascular: No carotid bruit.     Trachea: Trachea normal.  Cardiovascular:     Rate and Rhythm: Normal rate and regular rhythm.     Heart sounds: Normal heart sounds. No murmur heard.    No gallop.  Pulmonary:     Effort: Pulmonary effort is normal. No accessory muscle usage or respiratory distress.     Breath sounds: Normal breath sounds.  Chest:  Breasts:    Right: Normal.  Left: Normal.  Abdominal:     General: Bowel sounds  are normal.     Palpations: Abdomen is soft. There is no hepatomegaly or splenomegaly.     Tenderness: There is no abdominal tenderness.  Musculoskeletal:        General: Normal range of motion.     Cervical back: Normal range of motion and neck supple.     Right lower leg: No edema.     Left lower leg: No edema.  Lymphadenopathy:     Head:     Right side of head: No submental, submandibular, tonsillar, preauricular or posterior auricular adenopathy.     Left side of head: No submental, submandibular, tonsillar, preauricular or posterior auricular adenopathy.     Cervical: No cervical adenopathy.     Upper Body:     Right upper body: No supraclavicular, axillary or pectoral adenopathy.     Left upper body: No supraclavicular, axillary or pectoral adenopathy.  Skin:    General: Skin is warm and dry.     Capillary Refill: Capillary refill takes less than 2 seconds.     Findings: No rash.  Neurological:     Mental Status: She is alert and oriented to person, place, and time.     Gait: Gait is intact.     Deep Tendon Reflexes: Reflexes are normal and symmetric.     Reflex Scores:      Brachioradialis reflexes are 2+ on the right side and 2+ on the left side.      Patellar reflexes are 2+ on the right side and 2+ on the left side. Psychiatric:        Attention and Perception: Attention normal.        Mood and Affect: Mood normal.        Speech: Speech normal.        Behavior: Behavior normal. Behavior is cooperative.        Thought Content: Thought content normal.        Judgment: Judgment normal.     Results for orders placed or performed in visit on 02/26/21  Cytology - PAP  Result Value Ref Range   Neisseria Gonorrhea Negative    Chlamydia Negative    Adequacy      Satisfactory for evaluation; transformation zone component PRESENT.   Diagnosis      - Negative for intraepithelial lesion or malignancy (NILM)   Comment Normal Reference Ranger Chlamydia - Negative    Comment       Normal Reference Range Neisseria Gonorrhea - Negative      Assessment & Plan:   Problem List Items Addressed This Visit   None Visit Diagnoses     Annual physical exam    -  Primary   Health maintenance reviewed during visit today. Labs ordered. HPV given during visit. PAP up to date.   Relevant Orders   CBC with Differential/Platelet   Comprehensive metabolic panel   Lipid panel   TSH   Urinalysis, Routine w reflex microscopic   Screening for ischemic heart disease       Relevant Orders   Lipid panel   Need for HPV vaccination       Relevant Orders   HPV 9-valent vaccine,Recombinat        Follow up plan: No follow-ups on file.   LABORATORY TESTING:  - Pap smear: up to date  IMMUNIZATIONS:   - Tdap: Tetanus vaccination status reviewed: last tetanus booster within 10 years. - Influenza: Postponed  to flu season - Pneumovax: Not applicable - Prevnar: Not applicable - COVID: Up to date - HPV: Administered today - Shingrix vaccine: Not applicable  SCREENING: -Mammogram: Not applicable  - Colonoscopy: Not applicable  - Bone Density: Not applicable  -Hearing Test: Not applicable  -Spirometry: Not applicable   PATIENT COUNSELING:   Advised to take 1 mg of folate supplement per day if capable of pregnancy.   Sexuality: Discussed sexually transmitted diseases, partner selection, use of condoms, avoidance of unintended pregnancy  and contraceptive alternatives.   Advised to avoid cigarette smoking.  I discussed with the patient that most people either abstain from alcohol or drink within safe limits (<=14/week and <=4 drinks/occasion for males, <=7/weeks and <= 3 drinks/occasion for females) and that the risk for alcohol disorders and other health effects rises proportionally with the number of drinks per week and how often a drinker exceeds daily limits.  Discussed cessation/primary prevention of drug use and availability of treatment for abuse.   Diet:  Encouraged to adjust caloric intake to maintain  or achieve ideal body weight, to reduce intake of dietary saturated fat and total fat, to limit sodium intake by avoiding high sodium foods and not adding table salt, and to maintain adequate dietary potassium and calcium preferably from fresh fruits, vegetables, and low-fat dairy products.    stressed the importance of regular exercise  Injury prevention: Discussed safety belts, safety helmets, smoke detector, smoking near bedding or upholstery.   Dental health: Discussed importance of regular tooth brushing, flossing, and dental visits.    NEXT PREVENTATIVE PHYSICAL DUE IN 1 YEAR. No follow-ups on file.

## 2021-11-27 LAB — URINALYSIS, ROUTINE W REFLEX MICROSCOPIC
Bilirubin, UA: NEGATIVE
Glucose, UA: NEGATIVE
Ketones, UA: NEGATIVE
Leukocytes,UA: NEGATIVE
Nitrite, UA: NEGATIVE
Protein,UA: NEGATIVE
Specific Gravity, UA: >1.03 — ABNORMAL HIGH (ref 1.005–1.030)
Urobilinogen, Ur: 0.2 mg/dL (ref 0.2–1.0)
pH, UA: 6 (ref 5.0–7.5)

## 2021-11-27 LAB — CBC WITH DIFFERENTIAL/PLATELET
Basophils Absolute: 0.1 x10E3/uL (ref 0.0–0.2)
Basos: 1 %
EOS (ABSOLUTE): 0.1 x10E3/uL (ref 0.0–0.4)
Eos: 2 %
Hematocrit: 43.7 % (ref 34.0–46.6)
Hemoglobin: 14.6 g/dL (ref 11.1–15.9)
Immature Grans (Abs): 0 x10E3/uL (ref 0.0–0.1)
Immature Granulocytes: 0 %
Lymphocytes Absolute: 3.2 x10E3/uL — ABNORMAL HIGH (ref 0.7–3.1)
Lymphs: 35 %
MCH: 28.9 pg (ref 26.6–33.0)
MCHC: 33.4 g/dL (ref 31.5–35.7)
MCV: 87 fL (ref 79–97)
Monocytes Absolute: 0.8 x10E3/uL (ref 0.1–0.9)
Monocytes: 9 %
Neutrophils Absolute: 4.8 x10E3/uL (ref 1.4–7.0)
Neutrophils: 53 %
Platelets: 447 x10E3/uL (ref 150–450)
RBC: 5.05 x10E6/uL (ref 3.77–5.28)
RDW: 12.7 % (ref 11.7–15.4)
WBC: 9 x10E3/uL (ref 3.4–10.8)

## 2021-11-27 LAB — COMPREHENSIVE METABOLIC PANEL WITH GFR
ALT: 11 IU/L (ref 0–32)
AST: 17 IU/L (ref 0–40)
Albumin/Globulin Ratio: 1.7 (ref 1.2–2.2)
Albumin: 4.5 g/dL (ref 3.9–5.0)
Alkaline Phosphatase: 81 IU/L (ref 44–121)
BUN/Creatinine Ratio: 16 (ref 9–23)
BUN: 14 mg/dL (ref 6–20)
Bilirubin Total: 0.4 mg/dL (ref 0.0–1.2)
CO2: 22 mmol/L (ref 20–29)
Calcium: 9.4 mg/dL (ref 8.7–10.2)
Chloride: 102 mmol/L (ref 96–106)
Creatinine, Ser: 0.88 mg/dL (ref 0.57–1.00)
Globulin, Total: 2.6 g/dL (ref 1.5–4.5)
Glucose: 85 mg/dL (ref 70–99)
Potassium: 4.5 mmol/L (ref 3.5–5.2)
Sodium: 138 mmol/L (ref 134–144)
Total Protein: 7.1 g/dL (ref 6.0–8.5)
eGFR: 92 mL/min/1.73 (ref >59)

## 2021-11-27 LAB — MICROSCOPIC EXAMINATION

## 2021-11-27 LAB — LIPID PANEL
Chol/HDL Ratio: 4 ratio (ref 0.0–4.4)
Cholesterol, Total: 158 mg/dL (ref 100–199)
HDL: 40 mg/dL (ref >39)
LDL Chol Calc (NIH): 105 mg/dL — ABNORMAL HIGH (ref 0–99)
Triglycerides: 68 mg/dL (ref 0–149)
VLDL Cholesterol Cal: 13 mg/dL (ref 5–40)

## 2021-11-27 LAB — TSH: TSH: 1.61 u[IU]/mL (ref 0.450–4.500)

## 2021-11-27 NOTE — Progress Notes (Signed)
Hi Kiara Franklin. It was nice to see you yesterday.  Your lab work looks good.  No concerns at this time. Continue with your current medication regimen.  Follow up as discussed.  Please let me know if you have any questions.

## 2021-11-27 NOTE — Progress Notes (Signed)
Hi Kiara Franklin.  Your urine looks good.  No concerns at this time.

## 2021-11-27 NOTE — Addendum Note (Signed)
Addended by: Larae Grooms on: 11/27/2021 08:06 AM   Modules accepted: Orders

## 2021-12-02 ENCOUNTER — Encounter: Payer: Self-pay | Admitting: Nurse Practitioner

## 2022-01-18 ENCOUNTER — Encounter: Payer: Self-pay | Admitting: Nurse Practitioner

## 2022-01-21 ENCOUNTER — Encounter: Payer: Self-pay | Admitting: Nurse Practitioner

## 2022-01-21 MED ORDER — WEGOVY 0.5 MG/0.5ML ~~LOC~~ SOAJ
0.5000 mg | SUBCUTANEOUS | 0 refills | Status: DC
Start: 2022-01-21 — End: 2022-06-18

## 2022-02-02 ENCOUNTER — Ambulatory Visit (INDEPENDENT_AMBULATORY_CARE_PROVIDER_SITE_OTHER): Payer: No Typology Code available for payment source

## 2022-02-02 VITALS — BP 110/70 | HR 95 | Ht 65.0 in | Wt 147.0 lb

## 2022-02-02 DIAGNOSIS — Z3042 Encounter for surveillance of injectable contraceptive: Secondary | ICD-10-CM | POA: Diagnosis not present

## 2022-02-02 LAB — POCT URINE PREGNANCY: Preg Test, Ur: NEGATIVE

## 2022-02-02 MED ORDER — MEDROXYPROGESTERONE ACETATE 150 MG/ML IM SUSP
150.0000 mg | Freq: Once | INTRAMUSCULAR | Status: AC
  Administered 2022-02-02: 150 mg via INTRAMUSCULAR

## 2022-02-02 NOTE — Progress Notes (Signed)
Date last pap: 02/06/21. Last Depo-Provera: 11/02/21. Side Effects if any: None. Urine HCG: negative  Serum HCG indicated? N/A. Depo-Provera 150 mg IM given by: Rocco Serene, LPN. Site: Left Deltoid Next appointment due 04/19/22 - 05/03/22.

## 2022-03-04 ENCOUNTER — Ambulatory Visit: Payer: No Typology Code available for payment source | Admitting: Nurse Practitioner

## 2022-04-19 NOTE — Progress Notes (Unsigned)
   PCP:  Larae Grooms, NP   No chief complaint on file.    HPI:      [name] is a 26 y.o. G1P1001 whose LMP was No LMP recorded., presents today for her annual examination.  Her menses are absent with depo.   Sex activity: single partner, contraception - Depo-Provera injections.  Last Pap: 02/26/21 Results were: no abnormalities  Hx of STDs: {STD hx:14358}  There is no FH of breast cancer. There is no FH of ovarian cancer. The patient {does:18564} do self-breast exams.  Tobacco use: {tob:20664} Alcohol use: {Alcohol:11675} No drug use.  Exercise: {exercise:31265}  She {does:18564} get adequate calcium and Vitamin D in her diet.  Hx of anxiety/depression, on zoloft 50 mg in past  Patient Active Problem List   Diagnosis Date Noted   Overweight (BMI 25.0-29.9) 10/15/2021   Elevated low density lipoprotein (LDL) cholesterol level 10/12/2021   Hypertension, postpartum condition or complication 11/12/2019   Anxiety, generalized 11/04/2019    Past Surgical History:  Procedure Laterality Date   KNEE SURGERY Left 11/2012    Family History  Problem Relation Age of Onset   Asthma Mother    Healthy Father    Thyroid disease Maternal Aunt    Cancer Maternal Grandmother        unknown type   COPD Neg Hx    Diabetes Neg Hx    Heart disease Neg Hx    Stroke Neg Hx     Social History   Socioeconomic History   Marital status: Married    Spouse name: Not on file   Number of children: Not on file   Years of education: Not on file   Highest education level: Not on file  Occupational History   Not on file  Tobacco Use   Smoking status: Never   Smokeless tobacco: Never  Vaping Use   Vaping Use: Never used  Substance and Sexual Activity   Alcohol use: No   Drug use: No   Sexual activity: Yes    Birth control/protection: None, Injection  Other Topics Concern   Not on file  Social History Narrative   Not on file   Social Determinants of Health    Financial Resource Strain: Not on file  Food Insecurity: Not on file  Transportation Needs: Not on file  Physical Activity: Not on file  Stress: Not on file  Social Connections: Not on file  Intimate Partner Violence: Not on file     Current Outpatient Medications:    medroxyPROGESTERone (DEPO-PROVERA) 150 MG/ML injection, Inject 1 mL (150 mg total) into the muscle every 3 (three) months., Disp: 1 mL, Rfl: 3   Semaglutide-Weight Management (WEGOVY) 0.5 MG/0.5ML SOAJ, Inject 0.5 mg into the skin once a week., Disp: 2 mL, Rfl: 0     ROS:  Review of Systems BREAST: No symptoms   Objective: There were no vitals taken for this visit.   OBGyn Exam  Results: No results found for this or any previous visit (from the past 24 hour(s)).  Assessment/Plan: No diagnosis found.  No orders of the defined types were placed in this encounter.            GYN counsel {counseling: 16159}     F/U  No follow-ups on file.  Ori Trejos B. Edras Wilford, PA-C 04/19/2022 7:39 PM

## 2022-04-20 ENCOUNTER — Ambulatory Visit (INDEPENDENT_AMBULATORY_CARE_PROVIDER_SITE_OTHER): Payer: No Typology Code available for payment source | Admitting: Obstetrics and Gynecology

## 2022-04-20 ENCOUNTER — Encounter: Payer: Self-pay | Admitting: Obstetrics and Gynecology

## 2022-04-20 ENCOUNTER — Ambulatory Visit: Payer: No Typology Code available for payment source

## 2022-04-20 VITALS — BP 120/80 | Ht 64.0 in | Wt 145.0 lb

## 2022-04-20 DIAGNOSIS — Z3042 Encounter for surveillance of injectable contraceptive: Secondary | ICD-10-CM | POA: Diagnosis not present

## 2022-04-20 DIAGNOSIS — F419 Anxiety disorder, unspecified: Secondary | ICD-10-CM

## 2022-04-20 DIAGNOSIS — R197 Diarrhea, unspecified: Secondary | ICD-10-CM | POA: Diagnosis not present

## 2022-04-20 DIAGNOSIS — K529 Noninfective gastroenteritis and colitis, unspecified: Secondary | ICD-10-CM | POA: Insufficient documentation

## 2022-04-20 DIAGNOSIS — Z01419 Encounter for gynecological examination (general) (routine) without abnormal findings: Secondary | ICD-10-CM

## 2022-04-20 MED ORDER — MEDROXYPROGESTERONE ACETATE 150 MG/ML IM SUSP
150.0000 mg | INTRAMUSCULAR | Status: AC
  Administered 2022-04-20 – 2022-07-13 (×2): 150 mg via INTRAMUSCULAR

## 2022-04-20 NOTE — Patient Instructions (Signed)
I value your feedback and you entrusting us with your care. If you get a [facility] patient survey, I would appreciate you taking the time to let us know about your experience today. Thank you! ? ? ?

## 2022-04-26 ENCOUNTER — Other Ambulatory Visit: Payer: Self-pay

## 2022-04-26 DIAGNOSIS — Z3042 Encounter for surveillance of injectable contraceptive: Secondary | ICD-10-CM

## 2022-04-26 MED ORDER — MEDROXYPROGESTERONE ACETATE 150 MG/ML IM SUSP: 150.0000 mg | INTRAMUSCULAR | 3 refills | Status: DC

## 2022-06-17 ENCOUNTER — Encounter: Payer: Self-pay | Admitting: Nurse Practitioner

## 2022-06-18 ENCOUNTER — Encounter: Payer: Self-pay | Admitting: Nurse Practitioner

## 2022-06-18 ENCOUNTER — Other Ambulatory Visit: Payer: Self-pay | Admitting: Nurse Practitioner

## 2022-06-18 ENCOUNTER — Telehealth (INDEPENDENT_AMBULATORY_CARE_PROVIDER_SITE_OTHER): Payer: 59 | Admitting: Nurse Practitioner

## 2022-06-18 VITALS — Wt 142.0 lb

## 2022-06-18 DIAGNOSIS — Z6824 Body mass index (BMI) 24.0-24.9, adult: Secondary | ICD-10-CM | POA: Diagnosis not present

## 2022-06-18 DIAGNOSIS — E663 Overweight: Secondary | ICD-10-CM | POA: Diagnosis not present

## 2022-06-18 MED ORDER — WEGOVY 1.7 MG/0.75ML ~~LOC~~ SOAJ: 1.7000 mg | SUBCUTANEOUS | 0 refills | Status: DC

## 2022-06-18 NOTE — Assessment & Plan Note (Signed)
Chronic. Doing well.  Has lost about 20lbs since starting Wegovy.  Would like to go to 1.7mg  dose.  Patient is aware of the side effects of medication.  Follow up in 6 weeks.  Call sooner if concerns arise.

## 2022-06-18 NOTE — Progress Notes (Signed)
Wt 142 lb (64.4 kg)   BMI 24.37 kg/m    Subjective:    Patient ID: Kiara Franklin, female    DOB: [birth year], 27 y.o.   MRN: [id number]  HPI: Kiara Franklin is a 27 y.o. female  Chief Complaint  Patient presents with   Weight Check   WEIGHT GAIN Duration: years Previous attempts at weight loss: yes Complications of obesity: HTN Peak weight:  Weight loss goal: 140 lb Weight loss to date: 6 months Requesting obesity pharmacotherapy: yes Current weight loss supplements/medications: yes Previous weight loss supplements/meds: no Calories:  Patient states she has lost a little over 20lbs.  She has been on 0.5mg  weekly.  She hasn't been able to get the 1mg  dose.  She would like to go to the 1.7mg .    Relevant past medical, surgical, family and social history reviewed and updated as indicated. Interim medical history since our last visit reviewed. Allergies and medications reviewed and updated.  Review of Systems  Constitutional:  Positive for appetite change. Negative for unexpected weight change.    Per HPI unless specifically indicated above     Objective:    Wt 142 lb (64.4 kg)   BMI 24.37 kg/m   Wt Readings from Last 3 Encounters:  06/18/22 142 lb (64.4 kg)  04/20/22 145 lb (65.8 kg)  02/02/22 147 lb (66.7 kg)    Physical Exam Vitals and nursing note reviewed.  Constitutional:      General: She is not in acute distress.    Appearance: She is not ill-appearing.  HENT:     Head: Normocephalic.     Right Ear: Hearing normal.     Left Ear: Hearing normal.     Nose: Nose normal.  Pulmonary:     Effort: Pulmonary effort is normal. No respiratory distress.  Neurological:     Mental Status: She is alert.  Psychiatric:        Mood and Affect: Mood normal.        Behavior: Behavior normal.        Thought Content: Thought content normal.        Judgment: Judgment normal.     Results for orders placed or performed in visit on 02/02/22  POCT urine pregnancy   Result Value Ref Range   Preg Test, Ur Negative Negative      Assessment & Plan:   Problem List Items Addressed This Visit       Other   Overweight (BMI 25.0-29.9) - Primary    Chronic. Doing well.  Has lost about 20lbs since starting Wegovy.  Would like to go to 1.7mg  dose.  Patient is aware of the side effects of medication.  Follow up in 6 weeks.  Call sooner if concerns arise.         Follow up plan: Return in about 6 weeks (around 07/30/2022).   This visit was completed via MyChart due to the restrictions of the COVID-19 pandemic. All issues as above were discussed and addressed. Physical exam was done as above through visual confirmation on MyChart. If it was felt that the patient should be evaluated in the office, they were directed there. The patient verbally consented to this visit. Location of the patient: Home Location of the provider: Office Those involved with this call:  Provider: Jon Billings, NP CMA: Yvonna Alanis, CMA Front Desk/Registration: Lynnell Catalan This encounter was conducted via video.  I spent 30 dedicated to the care of this patient on the date of this  encounter to include previsit review of symptoms, plan of care, medications and follow up, face to face time with the patient, and post visit ordering of testing.

## 2022-06-21 ENCOUNTER — Telehealth: Payer: Self-pay

## 2022-06-21 NOTE — Telephone Encounter (Signed)
Refilled 06/18/2022. Requested Prescriptions  Pending Prescriptions Disp Refills   WEGOVY 2.4 MG/0.75ML SOAJ [Pharmacy Med Name: WEGOVY 2.4 MG/0.75 ML PEN]      Sig: INJECT 2.4 MG INTO THE SKIN ONCE A WEEK FOR 28 DAYS.     Endocrinology:  Diabetes - GLP-1 Receptor Agonists - semaglutide Failed - 06/18/2022 10:39 AM      Failed - HBA1C in normal range and within 180 days    No results found for: "HGBA1C", "LABA1C"       Passed - Cr in normal range and within 360 days    Creatinine, Ser  Date Value Ref Range Status  11/26/2021 0.88 0.57 - 1.00 mg/dL Final   Creatinine, Urine  Date Value Ref Range Status  11/02/2019 42 mg/dL Final         Passed - Valid encounter within last 6 months    Recent Outpatient Visits           3 days ago Overweight (BMI 25.0-29.9)   [place], NP   6 months ago Annual physical exam   [place], NP   8 months ago Overweight (BMI 25.0-29.9)   [place] Jon Billings, NP   1 year ago Annual physical exam   [place], NP   3 years ago Allergic contact dermatitis due to plants, except food   [place], Rachel [place], [place]

## 2022-06-21 NOTE — Telephone Encounter (Signed)
PA for [facility] initiated and submitted via Cover My Meds. Key: [id number]

## 2022-06-23 NOTE — Telephone Encounter (Signed)
Approval letter received via fax. Mychart message sent to the patient notifying her of approval.

## 2022-07-13 ENCOUNTER — Ambulatory Visit (INDEPENDENT_AMBULATORY_CARE_PROVIDER_SITE_OTHER): Payer: 59

## 2022-07-13 VITALS — BP 137/92 | HR 87 | Ht 64.0 in | Wt 147.9 lb

## 2022-07-13 DIAGNOSIS — Z3042 Encounter for surveillance of injectable contraceptive: Secondary | ICD-10-CM | POA: Insufficient documentation

## 2022-07-13 NOTE — Patient Instructions (Signed)

## 2022-07-13 NOTE — Progress Notes (Signed)
    NURSE VISIT NOTE  Subjective:    Patient ID: Kiara Franklin, female    DOB: February 01, 1996, 27 y.o.   MRN: [id number]  HPI  Patient is a 27 y.o. G60P1001 female who presents for depo provera injection.   Objective:    Ht 5\' 4"  (1.626 m)   Wt 147 lb 14.4 oz (67.1 kg)   BMI 25.39 kg/m   Last Annual: 04/20/2022. Last pap: 02/19/2021. Last Depo-Provera: 04/20/2022. Side Effects if any: none. Serum HCG indicated? No . Depo-Provera 150 mg IM given by: Otila Kluver, LPN. Site: Left Deltoid  Lab Review    Assessment:   1. Encounter for surveillance of injectable contraceptive      Plan:   Next appointment due between 09/28/2022 and 10/12/2022.    Otila Kluver, LPN

## 2022-07-28 ENCOUNTER — Encounter: Payer: Self-pay | Admitting: Nurse Practitioner

## 2022-07-30 ENCOUNTER — Encounter: Payer: Self-pay | Admitting: Nurse Practitioner

## 2022-07-30 ENCOUNTER — Telehealth (INDEPENDENT_AMBULATORY_CARE_PROVIDER_SITE_OTHER): Payer: 59 | Admitting: Nurse Practitioner

## 2022-07-30 VITALS — Wt 140.6 lb

## 2022-07-30 DIAGNOSIS — E663 Overweight: Secondary | ICD-10-CM

## 2022-07-30 DIAGNOSIS — R638 Other symptoms and signs concerning food and fluid intake: Secondary | ICD-10-CM | POA: Diagnosis not present

## 2022-07-30 MED ORDER — WEGOVY 1.7 MG/0.75ML ~~LOC~~ SOAJ: 1.7000 mg | SUBCUTANEOUS | 0 refills | Status: DC

## 2022-07-30 NOTE — Progress Notes (Signed)
Wt 140 lb 9.6 oz (63.8 kg)   BMI 24.13 kg/m    Subjective:    Patient ID: Kiara Franklin, female    DOB: 1995/12/15, 27 y.o.   MRN: [id number]  HPI: Kiara Franklin is a 27 y.o. female  Chief Complaint  Patient presents with   Obesity   WEIGHT GAIN Duration: years Previous attempts at weight loss: yes Complications of obesity: HTN Peak weight:  Weight loss goal: 140 lb Weight loss to date: 6 months Requesting obesity pharmacotherapy: yes Current weight loss supplements/medications: yes Previous weight loss supplements/meds: no Calories:  Patient states she has lost a little over 25lbs.  She has been on 1.'7mg'$ .  She   She hasn't been able to get the '1mg'$  dose.  She would like to go to the 2.'4mg'$ .    Relevant past medical, surgical, family and social history reviewed and updated as indicated. Interim medical history since our last visit reviewed. Allergies and medications reviewed and updated.  Review of Systems  Constitutional:  Positive for appetite change. Negative for unexpected weight change.    Per HPI unless specifically indicated above     Objective:    Wt 140 lb 9.6 oz (63.8 kg)   BMI 24.13 kg/m   Wt Readings from Last 3 Encounters:  07/30/22 140 lb 9.6 oz (63.8 kg)  07/13/22 147 lb 14.4 oz (67.1 kg)  06/18/22 142 lb (64.4 kg)    Physical Exam Vitals and nursing note reviewed.  Constitutional:      General: She is not in acute distress.    Appearance: She is not ill-appearing.  HENT:     Head: Normocephalic.     Right Ear: Hearing normal.     Left Ear: Hearing normal.     Nose: Nose normal.  Pulmonary:     Effort: Pulmonary effort is normal. No respiratory distress.  Neurological:     Mental Status: She is alert.  Psychiatric:        Mood and Affect: Mood normal.        Behavior: Behavior normal.        Thought Content: Thought content normal.        Judgment: Judgment normal.     Results for orders placed or performed in visit on 02/02/22   POCT urine pregnancy  Result Value Ref Range   Preg Test, Ur Negative Negative      Assessment & Plan:   Problem List Items Addressed This Visit       Other   Overweight (BMI 25.0-29.9) - Primary    Ongoing.  Has lost about 25lbs since starting Wegovy.  Would like to increase to the 2.'4mg'$  wegovy dose.  Will send to [facility] mail in pharmacy for 3 month supply.         Follow up plan: Return if symptoms worsen or fail to improve.   This visit was completed via MyChart due to the restrictions of the COVID-19 pandemic. All issues as above were discussed and addressed. Physical exam was done as above through visual confirmation on MyChart. If it was felt that the patient should be evaluated in the office, they were directed there. The patient verbally consented to this visit. Location of the patient: Home Location of the provider: Office Those involved with this call:  Provider: Jon Billings, NP CMA: Yvonna Alanis, CMA Front Desk/Registration: Lynnell Catalan This encounter was conducted via video.  I spent 30 dedicated to the care of this patient on the date of this  encounter to include previsit review of symptoms, plan of care, medications and follow up, face to face time with the patient, and post visit ordering of testing.

## 2022-07-30 NOTE — Assessment & Plan Note (Signed)
Ongoing.  Has lost about 25lbs since starting Wegovy.  Would like to increase to the 2.'4mg'$  wegovy dose.  Will send to [facility] mail in pharmacy for 3 month supply.

## 2022-08-13 MED ORDER — WEGOVY 2.4 MG/0.75ML ~~LOC~~ SOAJ: 2.4000 mg | SUBCUTANEOUS | 0 refills | Status: DC

## 2022-08-13 NOTE — Telephone Encounter (Signed)
-----   Message from Jon Billings, NP sent at 07/30/2022  9:07 AM EST ----- Send in [place] 2.4mg  dose.

## 2022-10-01 ENCOUNTER — Encounter: Payer: Self-pay | Admitting: Obstetrics and Gynecology

## 2022-10-04 ENCOUNTER — Ambulatory Visit: Payer: 59

## 2022-10-18 ENCOUNTER — Encounter: Payer: Self-pay | Admitting: Nurse Practitioner

## 2022-10-19 ENCOUNTER — Telehealth (INDEPENDENT_AMBULATORY_CARE_PROVIDER_SITE_OTHER): Payer: 59 | Admitting: Physician Assistant

## 2022-10-19 DIAGNOSIS — B86 Scabies: Secondary | ICD-10-CM

## 2022-10-19 MED ORDER — PERMETHRIN 5 % EX CREA: TOPICAL_CREAM | CUTANEOUS | 1 refills | Status: DC

## 2022-10-19 NOTE — Progress Notes (Signed)
Virtual Visit via Video Note  I connected with Kiara Franklin on 10/19/22 at 11:20 AM EDT by a video enabled telemedicine application and verified that I am speaking with the correct person using two identifiers.  Today's Provider: Jacquelin Hawking, MHS, PA-C Introduced myself to the patient as a PA-C and provided education on APPs in clinical practice.   Location: Patient: at home  Provider: [facility], [facility]   I discussed the limitations of evaluation and management by telemedicine and the availability of in person appointments. The patient expressed understanding and agreed to proceed.   Chief Complaint  Patient presents with   Rash    Legs with itching    History of Present Illness:  RASH Duration:  weeks  about 2.5 weeks  Location: generalized, arms, and legs  She reports her husband is a Sports administrator and went on a retreat prior to her rash developing. She also reports recent yard work She reports her husband came in from doing yard work and was covered in [employer]  She reports she has searched the house and linens for bed bugs several times and has not found anything.  She reports her husband reports similar concerns with bumps on his legs and trunk but his don't seem to itch as bad  Itching: yes she reports it is worse at night  Burning: no Redness: no Oozing: yes Scaling: no Blisters: no Painful: no Fevers: no Change in detergents/soaps/personal care products: no Recent illness: no Recent travel:yes her husband traveled recently on a work retreat in [place]  History of same: no Context: worse and contacts with the same Alleviating factors:  Calamine lotion  Treatments attempted: Calamine lotion helps a little bit  Shortness of breath: no  Throat/tongue swelling: no Myalgias/arthralgias: no     Review of Systems  Skin:  Positive for itching and rash.      Observations/Objective:  Due to the nature of the virtual visit,  physical exam and observations are limited. Able to obtain the following observations:   Alert, oriented, Appears comfortable, in no acute distress.  No scleral injection, no appreciated hoarseness, tachypnea, wheeze or strider. Able to maintain conversation without visible strain.  No cough appreciated during visit.  Numerous papular lesions and excoriations on both legs. Visibility is mildly limited due to virtual visit   Assessment and Plan:  Problem List Items Addressed This Visit   None Visit Diagnoses     Scabies    -  Primary Acute, new concern Patient reports several raised bumps on her legs and trunk that are accompanied with severe itching that is worse predominantly at night She reports several potential sources of exposure to both scabies, poisonous plants, bedbugs, and other insects in the last few weeks Given her symptoms and the fact that only her and her husband are involved I am suspicious of scabies versus bedbugs at this time Will provide permethrin cream to assist with management. We reviewed clothing and home treatment options to prevent further spread I provided her instructions on how to use the cream and that she can repeat treatment in about 2 weeks if necessary. I also explained that if her symptoms are due to some kind of insect infestation that treatment would likely be symptomatic until she is able to remove insect source from her home. Patient voiced understanding and agreement with current plan. Follow-up as needed for persistent or progressing symptoms.   Relevant Medications   permethrin (ELIMITE) 5 % cream  Follow Up Instructions:    I discussed the assessment and treatment plan with the patient. The patient was provided an opportunity to ask questions and all were answered. The patient agreed with the plan and demonstrated an understanding of the instructions.   The patient was advised to call back or seek an in-person evaluation if the  symptoms worsen or if the condition fails to improve as anticipated.  I provided 15 minutes of non-face-to-face time during this encounter.  No follow-ups on file.   I, Yuji Walth E Cyndee Giammarco, PA-C, have reviewed all documentation for this visit. The documentation on 10/19/22 for the exam, diagnosis, procedures, and orders are all accurate and complete.   Jacquelin Hawking, MHS, PA-C [facility]

## 2023-01-04 ENCOUNTER — Encounter: Payer: Self-pay | Admitting: Nurse Practitioner

## 2023-01-10 ENCOUNTER — Ambulatory Visit (INDEPENDENT_AMBULATORY_CARE_PROVIDER_SITE_OTHER): Payer: 59 | Admitting: Physician Assistant

## 2023-01-10 ENCOUNTER — Encounter: Payer: Self-pay | Admitting: Physician Assistant

## 2023-01-10 VITALS — BP 134/92 | HR 91 | Ht 64.0 in | Wt 153.4 lb

## 2023-01-10 DIAGNOSIS — E7800 Pure hypercholesterolemia, unspecified: Secondary | ICD-10-CM | POA: Diagnosis not present

## 2023-01-10 DIAGNOSIS — Z23 Encounter for immunization: Secondary | ICD-10-CM

## 2023-01-10 DIAGNOSIS — Z0000 Encounter for general adult medical examination without abnormal findings: Secondary | ICD-10-CM

## 2023-01-10 DIAGNOSIS — Z136 Encounter for screening for cardiovascular disorders: Secondary | ICD-10-CM | POA: Diagnosis not present

## 2023-01-10 NOTE — Progress Notes (Unsigned)
Annual Physical Exam   Name: Kiara Franklin   MRN: [id number]    DOB: [birth year]   Date:01/10/2023  Today's Provider: Jacquelin Hawking, MHS, PA-C Introduced myself to the patient as a PA-C and provided education on APPs in clinical practice.         Subjective  Chief Complaint  Chief Complaint  Patient presents with   Annual Exam    HPI  Patient presents for annual CPE.  Diet: overall normal dietary intake  Exercise: She is going to the gym - does cardio and weights, 3x per week for about an hour each time   Sleep:"good" getting about 7 hours per night, feels well rested in the AM  Mood: "Good, positive"    Depression: Phq 9 is  negative    01/10/2023    2:27 PM 10/19/2022   11:13 AM 11/26/2021    3:44 PM 10/14/2021    3:59 PM 11/25/2020    9:31 AM  Depression screen PHQ 2/9  Decreased Interest 0 0 0 0 0  Down, Depressed, Hopeless 0 0 0 0 0  PHQ - 2 Score 0 0 0 0 0  Altered sleeping 0 0 0 0   Tired, decreased energy 0 0 0 0   Change in appetite 0 0 0 0   Feeling bad or failure about yourself  0 0 0 0   Trouble concentrating 0 0 0 0   Moving slowly or fidgety/restless 0 0 0 0   Suicidal thoughts 0 0 0 0   PHQ-9 Score 0 0 0 0   Difficult doing work/chores Not difficult at all Not difficult at all Not difficult at all Not difficult at all    Hypertension: BP Readings from Last 3 Encounters:  01/10/23 (!) 134/92  07/13/22 (!) 137/92  04/20/22 120/80   Obesity: Wt Readings from Last 3 Encounters:  01/10/23 153 lb 6.4 oz (69.6 kg)  07/30/22 140 lb 9.6 oz (63.8 kg)  07/13/22 147 lb 14.4 oz (67.1 kg)   BMI Readings from Last 3 Encounters:  01/10/23 26.33 kg/m  07/30/22 24.13 kg/m  07/13/22 25.39 kg/m     Vaccines:   HPV: needs 3rd vaccine to complete series, offered today  Tdap: UTD Shingrix: NA Pneumonia: NA Flu: postponed until flu season  COVID-19: Discussed vaccine and booster recommendations per available CDC guidelines     Hep C Screening:  completed  HIV screening: Completed  STD testing and prevention (HIV/chl/gon/syphilis): declines screening  Intimate partner violence: negative Sexual History:  she is sexually active with single monogamous female partner  Menstrual History/LMP/Abnormal Bleeding: she finished Depo around April and has some spotting 2 weeks ago  Discussed importance of follow up if any post-menopausal bleeding: not applicable Incontinence Symptoms: No.  Breast cancer hx: she denies family hx of breast cancer  - Last Mammogram: Recommend routine screening starting at age 35  - BRCA gene screening:   Osteoporosis Prevention : Discussed high calcium and vitamin D supplementation, weight bearing exercises Bone density :not applicable  Cervical cancer screening: UTD- due in 2025  Skin cancer: Discussed monitoring for atypical lesions  Colorectal cancer screening: She denies known family hx of colon cancer- recommend routine screening at age 48    Lung cancer:  Low Dose CT Chest recommended if Age 15-80 years, 20 pack-year currently smoking OR have quit w/in 15years. Patient does not qualify.   ECG: NA  Advanced Care Planning: A voluntary discussion about advance care planning  including the explanation and discussion of advance directives.  Discussed health care proxy and Living will, and the patient was able to identify a health care proxy as no one.  Patient does not have a living will in effect.  Lipids: Lab Results  Component Value Date   CHOL 158 11/26/2021   CHOL 157 11/25/2020   Lab Results  Component Value Date   HDL 40 11/26/2021   HDL 40 11/25/2020   Lab Results  Component Value Date   LDLCALC 105 (H) 11/26/2021   LDLCALC 102 (H) 11/25/2020   Lab Results  Component Value Date   TRIG 68 11/26/2021   TRIG 76 11/25/2020   Lab Results  Component Value Date   CHOLHDL 4.0 11/26/2021   CHOLHDL 3.9 11/25/2020   No results found for: "LDLDIRECT"  Glucose: Glucose  Date Value Ref Range  Status  11/26/2021 85 70 - 99 mg/dL Final  66/44/0347 80 65 - 99 mg/dL Final  42/59/5638 95 65 - 99 mg/dL Final   Glucose, Bld  Date Value Ref Range Status  11/04/2019 80 70 - 99 mg/dL Final    Comment:    Glucose reference range applies only to samples taken after fasting for at least 8 hours.  11/02/2019 86 70 - 99 mg/dL Final    Comment:    Glucose reference range applies only to samples taken after fasting for at least 8 hours.    Patient Active Problem List   Diagnosis Date Noted   Encounter for surveillance of injectable contraceptive 07/13/2022   Postprandial diarrhea 04/20/2022   Overweight (BMI 25.0-29.9) 10/15/2021   Elevated low density lipoprotein (LDL) cholesterol level 10/12/2021   Hypertension, postpartum condition or complication 11/12/2019   Anxiety, generalized 11/04/2019    Past Surgical History:  Procedure Laterality Date   KNEE SURGERY Left 11/2012    Family History  Problem Relation Age of Onset   Asthma Mother    Healthy Father    Thyroid disease Maternal Aunt    Cancer Maternal Grandmother        unknown type   COPD Neg Hx    Diabetes Neg Hx    Heart disease Neg Hx    Stroke Neg Hx     Social History   Socioeconomic History   Marital status: Married    Spouse name: Not on file   Number of children: Not on file   Years of education: Not on file   Highest education level: Not on file  Occupational History   Not on file  Tobacco Use   Smoking status: Never   Smokeless tobacco: Never  Vaping Use   Vaping status: Never Used  Substance and Sexual Activity   Alcohol use: No   Drug use: No   Sexual activity: Yes    Birth control/protection: Injection, Condom  Other Topics Concern   Not on file  Social History Narrative   Not on file   Social Determinants of Health   Financial Resource Strain: Not on file  Food Insecurity: Not on file  Transportation Needs: Not on file  Physical Activity: Not on file  Stress: Not on file   Social Connections: Not on file  Intimate Partner Violence: Not on file    No current outpatient medications on file.  Current Facility-Administered Medications:    medroxyPROGESTERone (DEPO-PROVERA) injection 150 mg, 150 mg, Intramuscular, Q90 days, Copland, Alicia B, PA-C, 150 mg at 07/13/22 1633  No Known Allergies   Review of Systems  Constitutional:  Negative for chills, fever, malaise/fatigue and weight loss.  HENT:  Negative for hearing loss, nosebleeds, sore throat and tinnitus.   Eyes:  Negative for blurred vision, double vision and photophobia.  Respiratory:  Negative for shortness of breath and wheezing.   Cardiovascular:  Negative for chest pain, palpitations and leg swelling.  Gastrointestinal:  Negative for blood in stool, constipation, diarrhea, heartburn, nausea and vomiting.  Genitourinary:  Negative for dysuria.  Musculoskeletal:  Negative for falls, joint pain and myalgias.  Skin:  Negative for itching and rash.  Neurological:  Negative for dizziness, tingling, tremors, loss of consciousness, weakness and headaches.  Psychiatric/Behavioral:  Negative for depression, memory loss, substance abuse and suicidal ideas. The patient is not nervous/anxious.       Objective  Vitals:   01/10/23 1422  BP: (!) 134/92  Pulse: 91  SpO2: 99%  Weight: 153 lb 6.4 oz (69.6 kg)  Height: 5\' 4"  (1.626 m)    Body mass index is 26.33 kg/m.  Physical Exam Vitals reviewed.  Constitutional:      General: She is awake.     Appearance: Normal appearance. She is well-developed and well-groomed.  HENT:     Head: Normocephalic and atraumatic.     Right Ear: Hearing, tympanic membrane and ear canal normal.     Left Ear: Hearing, tympanic membrane and ear canal normal.     Mouth/Throat:     Lips: Pink.     Pharynx: Oropharynx is clear. Uvula midline. No pharyngeal swelling, oropharyngeal exudate, posterior oropharyngeal erythema, uvula swelling or postnasal drip.  Eyes:      General: Lids are normal. Gaze aligned appropriately.     Extraocular Movements: Extraocular movements intact.     Right eye: Normal extraocular motion and no nystagmus.     Left eye: Normal extraocular motion and no nystagmus.     Conjunctiva/sclera: Conjunctivae normal.     Pupils: Pupils are equal, round, and reactive to light.  Neck:     Thyroid: No thyroid mass, thyromegaly or thyroid tenderness.  Cardiovascular:     Rate and Rhythm: Normal rate and regular rhythm.     Pulses: Normal pulses.          Radial pulses are 2+ on the right side and 2+ on the left side.     Heart sounds: Normal heart sounds. No murmur heard.    No friction rub. No gallop.  Pulmonary:     Effort: Pulmonary effort is normal.     Breath sounds: Normal breath sounds. No decreased air movement. No decreased breath sounds, wheezing, rhonchi or rales.  Abdominal:     General: Abdomen is flat. Bowel sounds are normal.     Palpations: Abdomen is soft.     Tenderness: There is no abdominal tenderness.  Musculoskeletal:     Cervical back: Normal range of motion and neck supple.     Right lower leg: No edema.     Left lower leg: No edema.  Lymphadenopathy:     Head:     Right side of head: No submental, submandibular or preauricular adenopathy.     Left side of head: No submental, submandibular or preauricular adenopathy.     Cervical:     Right cervical: No superficial or posterior cervical adenopathy.    Left cervical: No superficial or posterior cervical adenopathy.     Upper Body:     Right upper body: No supraclavicular adenopathy.     Left upper body: No supraclavicular adenopathy.  Neurological:     General: No focal deficit present.     Mental Status: She is alert and oriented to person, place, and time.     GCS: GCS eye subscore is 4. GCS verbal subscore is 5. GCS motor subscore is 6.     Cranial Nerves: No cranial nerve deficit, dysarthria or facial asymmetry.     Motor: No weakness, tremor or  atrophy.     Gait: Gait is intact.     Deep Tendon Reflexes:     Reflex Scores:      Patellar reflexes are 2+ on the right side and 2+ on the left side. Psychiatric:        Attention and Perception: Attention and perception normal.        Mood and Affect: Mood and affect normal.        Speech: Speech normal.        Behavior: Behavior normal. Behavior is cooperative.        Thought Content: Thought content normal.        Cognition and Memory: Cognition normal.      No results found for this or any previous visit (from the past 2160 hour(s)).   Fall Risk:    01/10/2023    2:27 PM 10/19/2022   11:13 AM 06/18/2022   10:23 AM 11/26/2021    3:44 PM 10/14/2021    3:59 PM  Fall Risk   Falls in the past year? 0 0 0 0 0  Number falls in past yr: 0 0 0 0 0  Injury with Fall? 0 0 0 0 0  Risk for fall due to : No Fall Risks  No Fall Risks No Fall Risks No Fall Risks  Follow up Falls evaluation completed  Falls evaluation completed Falls evaluation completed Falls evaluation completed     Functional Status Survey:     Assessment & Plan  Problem List Items Addressed This Visit       Other   Elevated low density lipoprotein (LDL) cholesterol level   Other Visit Diagnoses     Annual physical exam    -  Primary  -USPSTF grade A and B recommendations reviewed with patient; age-appropriate recommendations, preventive care, screening tests, etc discussed and encouraged; healthy living encouraged; see AVS for patient education given to patient -Discussed importance of 150 minutes of physical activity weekly, eat two servings of fish weekly, eat one serving of tree nuts ( cashews, pistachios, pecans, almonds.Marland Kitchen) every other day, eat 6 servings of fruit/vegetables daily and drink plenty of water and avoid sweet beverages.   -Reviewed Health Maintenance: Yes.      Relevant Orders   Comp Met (CMET)   CBC w/Diff   Lipid Profile   HgB A1c   TSH   Screening for ischemic heart disease        Relevant Orders   Lipid Profile   Need for HPV vaccine       Relevant Orders   HPV 9-valent vaccine,Recombinat        Return in about 1 year (around 01/10/2024) for annual phys and labs.   I, Lecretia Buczek E Kiora Hallberg, PA-C, have reviewed all documentation for this visit. The documentation on 01/10/23 for the exam, diagnosis, procedures, and orders are all accurate and complete.   Jacquelin Hawking, MHS, PA-C [facility]

## 2023-01-13 ENCOUNTER — Encounter: Payer: Self-pay | Admitting: Physician Assistant

## 2023-01-13 NOTE — Telephone Encounter (Signed)
Is there swelling or bruising? Sometimes there can be a bit of a "miss" or the needle goes through the vein and causes some bruising that can lead to soreness. Usually warm/cool compresses and OTC pain relievers can help with this but if it persists or gets worse, let us know.

## 2023-06-11 ENCOUNTER — Telehealth: Payer: Commercial Managed Care - PPO | Admitting: Family Medicine

## 2023-06-11 DIAGNOSIS — H6991 Unspecified Eustachian tube disorder, right ear: Secondary | ICD-10-CM

## 2023-06-11 MED ORDER — FLUTICASONE PROPIONATE 50 MCG/ACT NA SUSP
2.0000 | Freq: Every day | NASAL | 0 refills | Status: DC
Start: 2023-06-11 — End: 2023-12-06

## 2023-06-11 NOTE — Progress Notes (Signed)
E-Visit for Ear Pain - Eustachian Tube Dysfunction   We are sorry that you are not feeling well. Here is how we plan to help!  Based on what you have shared with me it looks like you have Eustachian Tube Dysfunction.  Eustachian Tube Dysfunction is a condition where the tubes that connect your middle ears to your upper throat become blocked. This can lead to discomfort, hearing difficulties and a feeling of fullness in your ear. Eustachian tube dysfunction usually resolves itself in a few days. The usual symptoms include: Hearing problems Tinnitus, or ringing in your ears Clicking or popping sounds A feeling of fullness in your ears Pain that mimics an ear infection Dizziness, vertigo or balance problems A "tickling" sensation in your ears  ?Eustachian tube dysfunction symptoms may get worse in higher altitudes. This is called barotrauma, and it can happen while scuba diving, flying in an airplane or driving in the mountains.   What causes eustachian tube dysfunction? Allergies and infections (like the common cold and the flu) are the most common causes of eustachian tube dysfunction. These conditions can cause inflammation and mucus buildup, leading to blockage. GERD, or chronic acid reflux, can also cause ETD. This is because stomach acid can back up into your throat and result in inflammation. As mentioned above, altitude changes can also cause ETD.   What are some common eustachian tube dysfunction treatments? In most cases, treatment isn't necessary because ETD often resolves on its own. However, you might need treatment if your symptoms linger for more than two weeks.    Eustachian tube dysfunction treatment depends on the cause and the severity of your condition. Treatments may include home remedies, medications or, in severe cases, surgery.     HOME CARE: Sometimes simple home remedies can help with mild cases of eustachian tube dysfunction. To try and clear the blockage, you  can: Chew gum. Yawn. Swallow. Try the Valsalva maneuver (breathing out forcefully while closing your mouth and pinching your nostrils). Use a saline spray to clear out nasal passages.  MEDICATIONS: Over-the-counter medications can help if allergies are causing eustachian tube dysfunction. Try antihistamines (like cetirizine or diphenhydramine) to ease your symptoms. If you have discomfort, pain relievers -- such as acetaminophen or ibuprofen -- can help.  Sometimes intranasal glucocorticosteroids (like Flonase or Nasacort) help.  I have prescribed Fluticasone 50 mcg/spray 2 sprays in each nostril daily for 10-14 days    GET HELP RIGHT AWAY IF: Fever is over 102.2 degrees. You develop progressive ear pain or hearing loss. Ear symptoms persist longer than 3 days after treatment.  MAKE SURE YOU: Understand these instructions. Will watch your condition. Will get help right away if you are not doing well or get worse.  Thank you for choosing an e-visit.  Your e-visit answers were reviewed by a board certified advanced clinical practitioner to complete your personal care plan. Depending upon the condition, your plan could have included both over the counter or prescription medications.  Please review your pharmacy choice. Make sure the pharmacy is open so you can pick up the prescription now. If there is a problem, you may contact your provider through [employer] and have the prescription routed to another pharmacy.  Your safety is important to [place]. If you have drug allergies check your prescription carefully.   For the next 24 hours you can use MyChart to ask questions about today's visit, request a non-urgent call back, or ask for a work or school excuse. You will  get an email with a survey after your eVisit asking about your experience. We would appreciate your feedback. I hope that your e-visit has been valuable and will aid in your recovery.  I have spent 5 minutes in review of  e-visit questionnaire, review and updating patient chart, medical decision making and response to patient.   Reed Pandy, PA-C

## 2023-06-12 ENCOUNTER — Telehealth: Payer: Commercial Managed Care - PPO | Admitting: Family Medicine

## 2023-06-12 ENCOUNTER — Telehealth: Payer: Commercial Managed Care - PPO

## 2023-06-12 DIAGNOSIS — H6691 Otitis media, unspecified, right ear: Secondary | ICD-10-CM

## 2023-06-12 MED ORDER — AMOXICILLIN-POT CLAVULANATE 875-125 MG PO TABS: 1.0000 | ORAL_TABLET | Freq: Two times a day (BID) | ORAL | 0 refills | Status: DC

## 2023-06-12 NOTE — Progress Notes (Signed)
 Virtual Visit Consent   Kiara Franklin, you are scheduled for a virtual visit with a [facility] provider today. Just as with appointments in the office, your consent must be obtained to participate. Your consent will be active for this visit and any virtual visit you may have with one of our providers in the next 365 days. If you have a MyChart account, a copy of this consent can be sent to you electronically.  As this is a virtual visit, video technology does not allow for your provider to perform a traditional examination. This may limit your provider's ability to fully assess your condition. If your provider identifies any concerns that need to be evaluated in person or the need to arrange testing (such as labs, EKG, etc.), we will make arrangements to do so. Although advances in technology are sophisticated, we cannot ensure that it will always work on either your end or our end. If the connection with a video visit is poor, the visit may have to be switched to a telephone visit. With either a video or telephone visit, we are not always able to ensure that we have a secure connection.  By engaging in this virtual visit, you consent to the provision of healthcare and authorize for your insurance to be billed (if applicable) for the services provided during this visit. Depending on your insurance coverage, you may receive a charge related to this service.  I need to obtain your verbal consent now. Are you willing to proceed with your visit today? [name] has provided verbal consent on 06/12/2023 for a virtual visit (video or telephone). Loa Lamp, FNP  Date: 06/12/2023 5:17 PM  Virtual Visit via Video Note   I, Loa Lamp, connected with  Hildur Bayer  ([id number], 11/16/1995) on 06/12/23 at  5:15 PM EST by a video-enabled telemedicine application and verified that I am speaking with the correct person using two identifiers.  Location: Patient: Virtual Visit Location Patient:  Home Provider: Virtual Visit Location Provider: Home Office   I discussed the limitations of evaluation and management by telemedicine and the availability of in person appointments. The patient expressed understanding and agreed to proceed.    History of Present Illness: Kiara Franklin is a 28 y.o. who identifies as a female who was assigned female at birth, and is being seen today for rt ear pain, pressure, no fever, she has had congestion for 1-2 weeks. No drainage.   HPI: HPI  Problems:  Patient Active Problem List   Diagnosis Date Noted   Encounter for surveillance of injectable contraceptive 07/13/2022   Postprandial diarrhea 04/20/2022   Overweight (BMI 25.0-29.9) 10/15/2021   Elevated low density lipoprotein (LDL) cholesterol level 10/12/2021   Hypertension, postpartum condition or complication 11/12/2019   Anxiety, generalized 11/04/2019    Allergies: No Known Allergies Medications:  Current Outpatient Medications:    amoxicillin -clavulanate (AUGMENTIN ) 875-125 MG tablet, Take 1 tablet by mouth 2 (two) times daily., Disp: 20 tablet, Rfl: 0   fluticasone  (FLONASE ) 50 MCG/ACT nasal spray, Place 2 sprays into both nostrils daily., Disp: 16 g, Rfl: 0  Current Facility-Administered Medications:    medroxyPROGESTERone  (DEPO-PROVERA ) injection 150 mg, 150 mg, Intramuscular, Q90 days, Copland, Alicia B, PA-C, 150 mg at 07/13/22 1633  Observations/Objective: Patient is well-developed, well-nourished in no acute distress.  Resting comfortably  at home.  Head is normocephalic, atraumatic.  No labored breathing.  Speech is clear and coherent with logical content.  Patient is alert and oriented at baseline.  Assessment and Plan: 1. Right otitis media, unspecified otitis media type (Primary)  Increase fluids, take meds with food and yogurt or probiotics, UC if s persist or worsen.   Follow Up Instructions: I discussed the assessment and treatment plan with the patient. The  patient was provided an opportunity to ask questions and all were answered. The patient agreed with the plan and demonstrated an understanding of the instructions.  A copy of instructions were sent to the patient via MyChart unless otherwise noted below.     The patient was advised to call back or seek an in-person evaluation if the symptoms worsen or if the condition fails to improve as anticipated.    Clarrisa Kaylor, FNP

## 2023-06-12 NOTE — Patient Instructions (Signed)

## 2023-07-05 IMAGING — US US PELVIS COMPLETE WITH TRANSVAGINAL
1 series · 14 of 25 positions shown · non-contrast
Comparison: None

CLINICAL DATA: Left lower quadrant pain

EXAM:
TRANSABDOMINAL AND TRANSVAGINAL ULTRASOUND OF PELVIS
TECHNIQUE: Both transabdominal and transvaginal ultrasound examinations of the
pelvis were performed. Transabdominal technique was performed for
global imaging of the pelvis including uterus, ovaries, adnexal
regions, and pelvic cul-de-sac. It was necessary to proceed with
endovaginal exam following the transabdominal exam to visualize the
endometrium and adnexal structures.

[Series 1: us pelvis complete with transvaginal · 0.21mm/px · 14 of 85 slices shown]
[im 1/85]
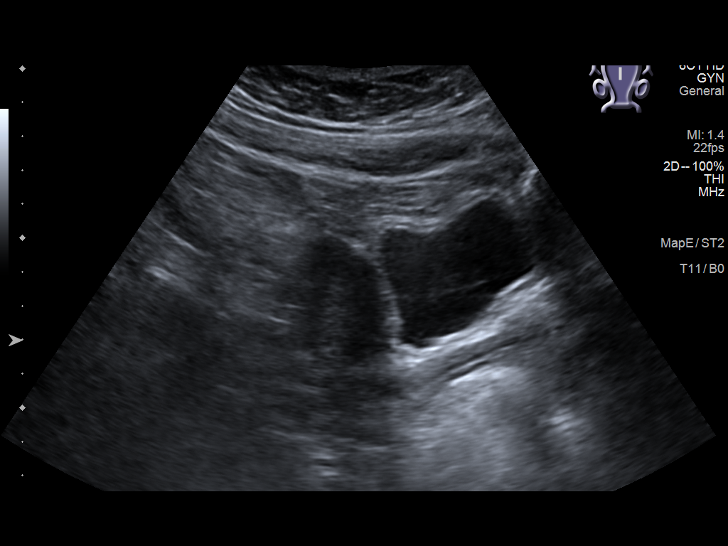
[im 8/85]
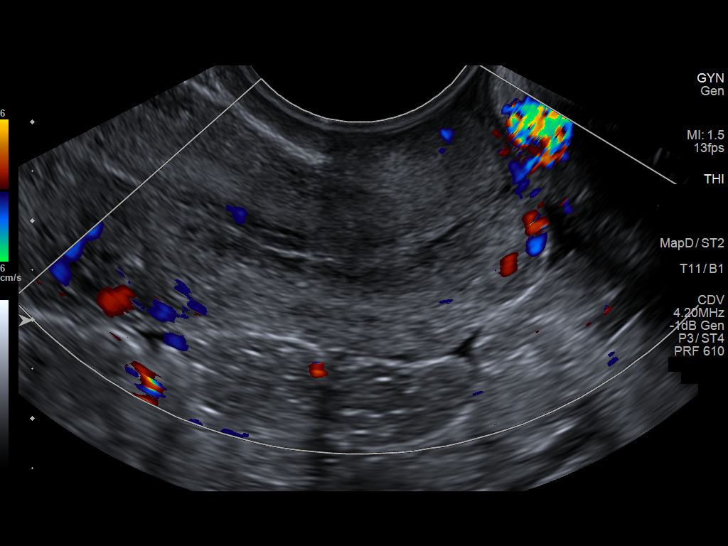
[im 15/85]
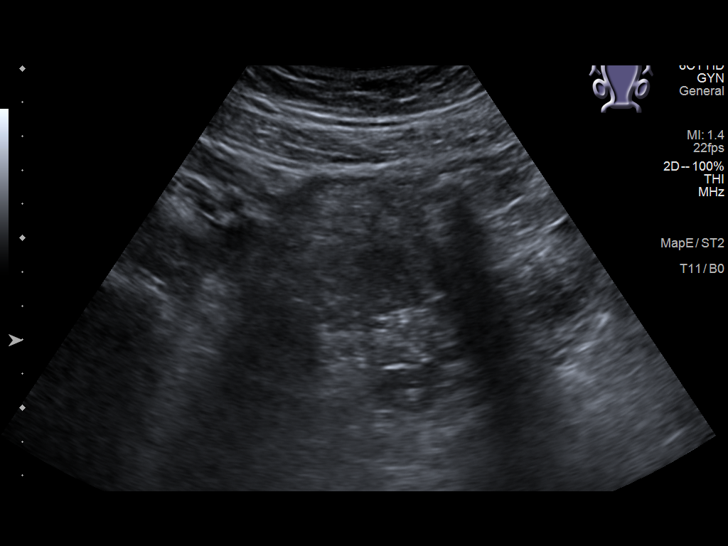
[im 22/85]
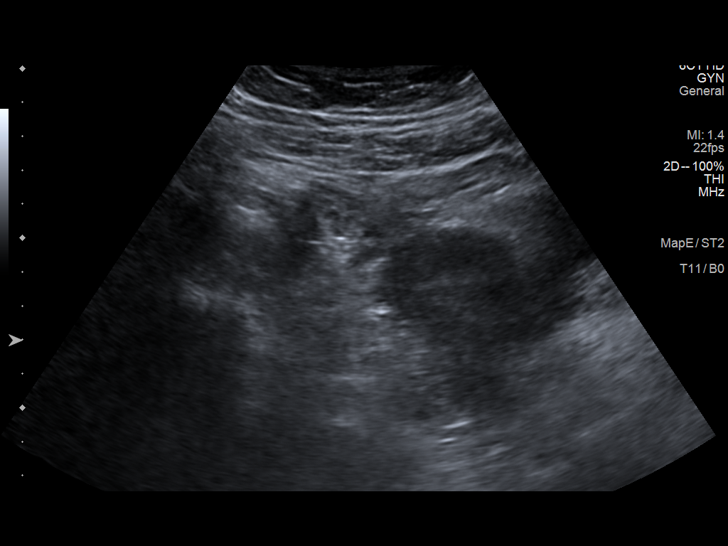
[im 29/85]
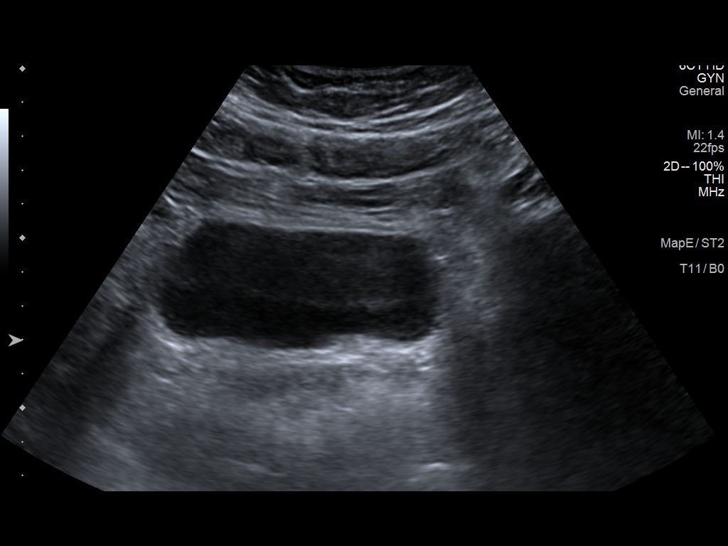
[im 32/85]
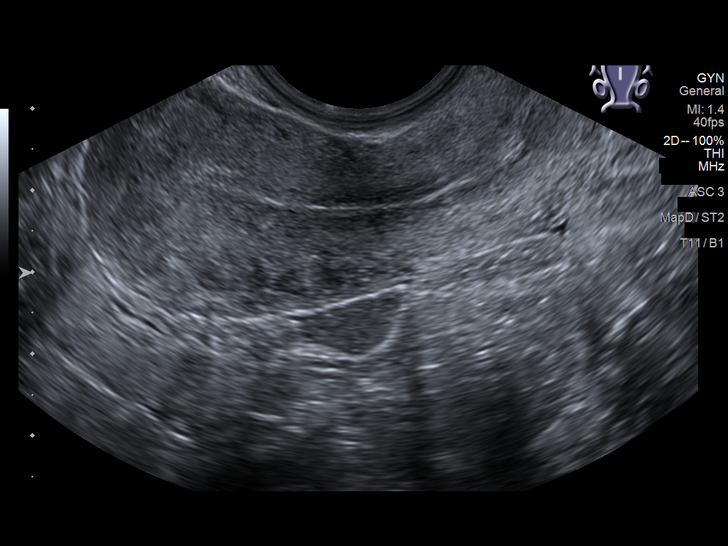
[im 39/85]
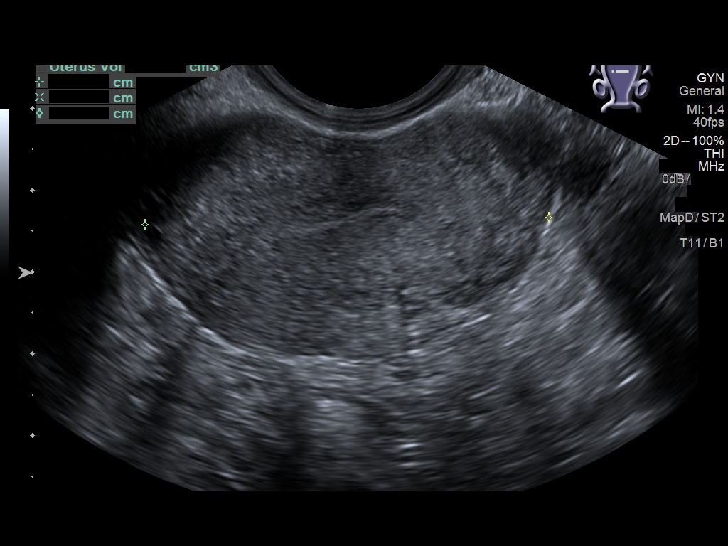
[im 46/85]
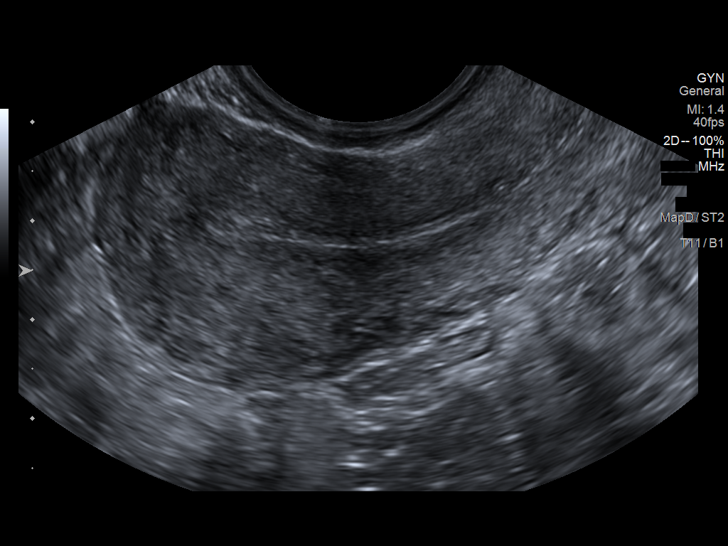
[im 53/85]
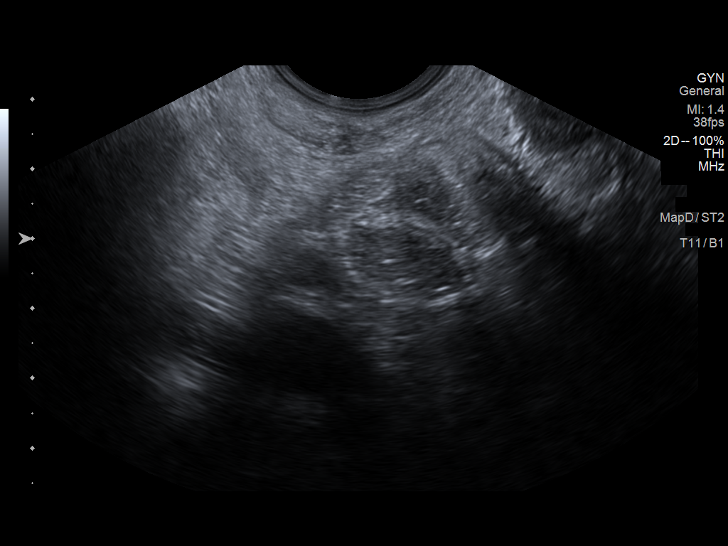
[im 57/85]
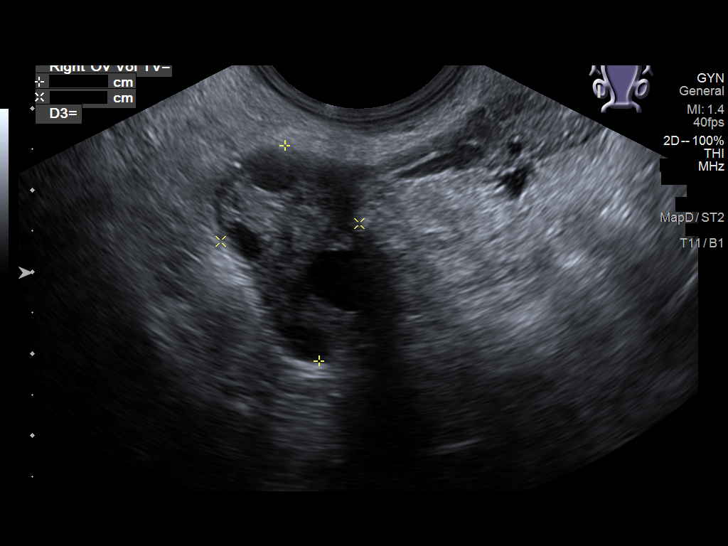
[im 64/85]
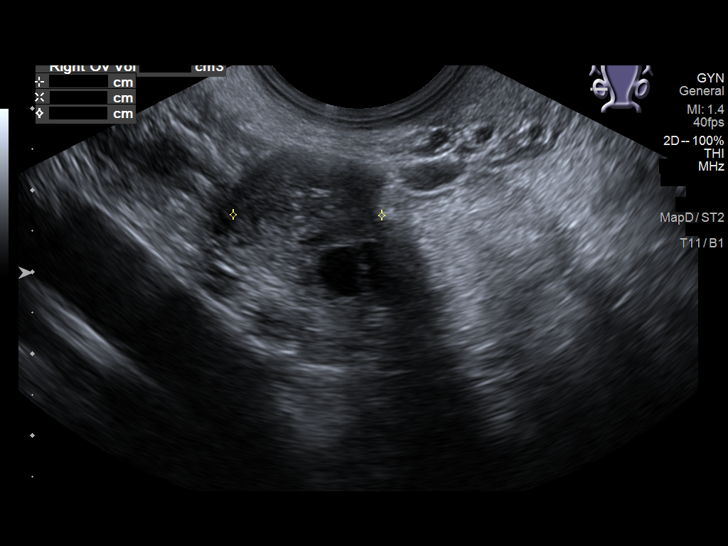
[im 71/85]
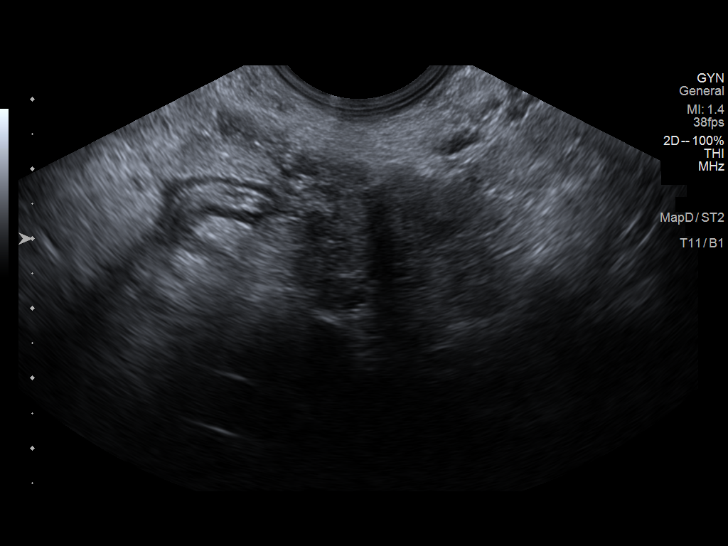
[im 78/85]
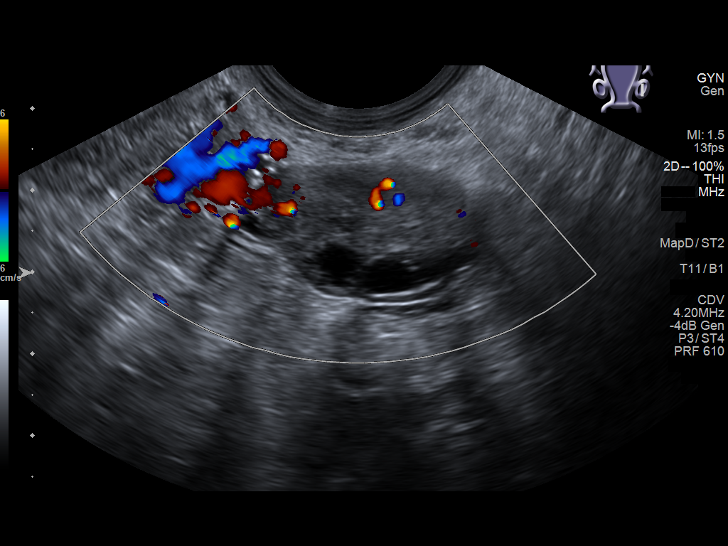
[im 85/85]
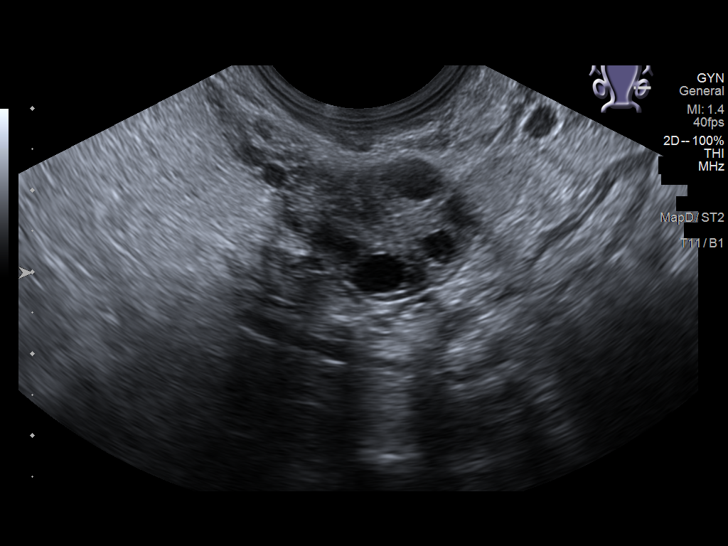

[14 of 25 positions shown; findings below may reference images not displayed]

FINDINGS: Uterus

Measurements: 5.8 x 3.0 x 4.8 cm = volume: 44.5 mL. No fibroids or
other mass visualized.

Endometrium

Thickness: 4 mm.  No focal abnormality visualized.

Right ovary

Measurements: 2.7 x 1.7 x 1.8 cm = volume: 4.3 mL. Normal
appearance/no adnexal mass.

Left ovary

Measurements: 2.9 x 1.8 x 2.2 cm = volume: 5.7 mL. Normal
appearance/no adnexal mass.

Other findings

No abnormal free fluid.
IMPRESSION: 1. Age-appropriate pelvic ultrasound.  No acute findings.

## 2023-11-17 ENCOUNTER — Encounter: Payer: Self-pay | Admitting: Obstetrics and Gynecology

## 2023-11-28 ENCOUNTER — Ambulatory Visit: Admitting: Obstetrics and Gynecology

## 2023-12-05 NOTE — Progress Notes (Unsigned)
    Melvin Pao, NP   No chief complaint on file.   HPI:      [name] is a 28 y.o. G1P1001 whose LMP was No LMP recorded., presents today for ***    Patient Active Problem List   Diagnosis Date Noted   Encounter for surveillance of injectable contraceptive 07/13/2022   Postprandial diarrhea 04/20/2022   Overweight (BMI 25.0-29.9) 10/15/2021   Elevated low density lipoprotein (LDL) cholesterol level 10/12/2021   Hypertension, postpartum condition or complication 11/12/2019   Anxiety, generalized 11/04/2019    Past Surgical History:  Procedure Laterality Date   KNEE SURGERY Left 11/2012    Family History  Problem Relation Age of Onset   Asthma Mother    Healthy Father    Thyroid  disease Maternal Aunt    Cancer Maternal Grandmother        unknown type   COPD Neg Hx    Diabetes Neg Hx    Heart disease Neg Hx    Stroke Neg Hx     Social History   Socioeconomic History   Marital status: Married    Spouse name: Not on file   Number of children: Not on file   Years of education: Not on file   Highest education level: Not on file  Occupational History   Not on file  Tobacco Use   Smoking status: Never   Smokeless tobacco: Never  Vaping Use   Vaping status: Never Used  Substance and Sexual Activity   Alcohol use: No   Drug use: No   Sexual activity: Yes    Birth control/protection: Injection, Condom  Other Topics Concern   Not on file  Social History Narrative   Not on file   Social Drivers of Health   Financial Resource Strain: Not on file  Food Insecurity: Not on file  Transportation Needs: Not on file  Physical Activity: Not on file  Stress: Not on file  Social Connections: Not on file  Intimate Partner Violence: Not on file    Outpatient Medications Prior to Visit  Medication Sig Dispense Refill   amoxicillin -clavulanate (AUGMENTIN ) 875-125 MG tablet Take 1 tablet by mouth 2 (two) times daily. 20 tablet 0   fluticasone   (FLONASE ) 50 MCG/ACT nasal spray Place 2 sprays into both nostrils daily. 16 g 0   No facility-administered medications prior to visit.      ROS:  Review of Systems BREAST: No symptoms   OBJECTIVE:   Vitals:  There were no vitals taken for this visit.  Physical Exam  Results: No results found for this or any previous visit (from the past 24 hours).   Assessment/Plan: No diagnosis found.    No orders of the defined types were placed in this encounter.     No follow-ups on file.  Kendrea Cerritos B. Meher Kucinski, PA-C 12/05/2023 4:55 PM

## 2023-12-06 ENCOUNTER — Ambulatory Visit: Admitting: Obstetrics and Gynecology

## 2023-12-06 ENCOUNTER — Encounter: Payer: Self-pay | Admitting: Obstetrics and Gynecology

## 2023-12-06 VITALS — BP 137/88 | HR 94 | Ht 64.0 in | Wt 143.0 lb

## 2023-12-06 DIAGNOSIS — F32A Depression, unspecified: Secondary | ICD-10-CM | POA: Diagnosis not present

## 2023-12-06 DIAGNOSIS — F419 Anxiety disorder, unspecified: Secondary | ICD-10-CM | POA: Diagnosis not present

## 2023-12-06 MED ORDER — HYDROXYZINE HCL 25 MG PO TABS: 25.0000 mg | ORAL_TABLET | Freq: Four times a day (QID) | ORAL | 1 refills | Status: DC | PRN

## 2023-12-06 MED ORDER — SERTRALINE HCL 50 MG PO TABS: ORAL_TABLET | ORAL | 1 refills | Status: DC

## 2023-12-06 NOTE — Patient Instructions (Signed)
 I value your feedback and you entrusting [place] with your care. If you get a [facility] patient survey, I would appreciate you taking the time to let us know about your experience today. Thank you! ? ? ?

## 2023-12-28 ENCOUNTER — Other Ambulatory Visit: Payer: Self-pay | Admitting: Obstetrics and Gynecology

## 2023-12-28 DIAGNOSIS — F32A Depression, unspecified: Secondary | ICD-10-CM

## 2024-01-09 ENCOUNTER — Encounter: Payer: Self-pay | Admitting: Obstetrics and Gynecology

## 2024-01-10 NOTE — Telephone Encounter (Signed)
 Pls get info from pt and do GAD/PHQ. Takes 6-8 wks to reach max effectiveness and if no real improvement then, we can adjust her dose at the 7 wk f/u appt.

## 2024-01-12 ENCOUNTER — Other Ambulatory Visit: Payer: Self-pay | Admitting: Obstetrics and Gynecology

## 2024-01-12 DIAGNOSIS — F32A Depression, unspecified: Secondary | ICD-10-CM

## 2024-01-12 MED ORDER — SERTRALINE HCL 100 MG PO TABS
100.0000 mg | ORAL_TABLET | Freq: Every day | ORAL | 0 refills | Status: DC
Start: 2024-01-12 — End: 2024-02-13

## 2024-01-23 ENCOUNTER — Ambulatory Visit: Admitting: Obstetrics and Gynecology

## 2024-01-24 ENCOUNTER — Encounter: Payer: Self-pay | Admitting: Obstetrics and Gynecology

## 2024-01-24 NOTE — Telephone Encounter (Signed)
 Contacted the patient via phone, per patient request needed to schedule one week farther out due to child schooling. Patient has been rescheduled for 02/07/24 with [facility]

## 2024-01-24 NOTE — Telephone Encounter (Signed)
 Please change to 3 wks from 01/09/24. Thx.

## 2024-01-25 ENCOUNTER — Ambulatory Visit: Admitting: Obstetrics and Gynecology

## 2024-01-26 ENCOUNTER — Encounter: Payer: Self-pay | Admitting: Nurse Practitioner

## 2024-01-26 ENCOUNTER — Ambulatory Visit (INDEPENDENT_AMBULATORY_CARE_PROVIDER_SITE_OTHER): Admitting: Nurse Practitioner

## 2024-01-26 VITALS — BP 137/90 | HR 80 | Temp 98.6°F | Ht 64.0 in | Wt 137.2 lb

## 2024-01-26 DIAGNOSIS — F411 Generalized anxiety disorder: Secondary | ICD-10-CM

## 2024-01-26 DIAGNOSIS — Z0000 Encounter for general adult medical examination without abnormal findings: Secondary | ICD-10-CM

## 2024-01-26 DIAGNOSIS — Z136 Encounter for screening for cardiovascular disorders: Secondary | ICD-10-CM

## 2024-01-26 NOTE — Assessment & Plan Note (Signed)
 Chronic. Not well controlled.  Zoloft  was increased about a week ago with OB.  Continue to follow up with specialist. Can follow up if here she prefers.

## 2024-01-26 NOTE — Progress Notes (Signed)
 BP (!) 137/90   Pulse 80   Temp 98.6 F (37 C) (Oral)   Ht 5' 4 (1.626 m)   Wt 137 lb 3.2 oz (62.2 kg)   SpO2 99%   BMI 23.55 kg/m    Subjective:    Patient ID: Kiara Franklin, female    DOB: 17-Jun-1995, 28 y.o.   MRN: [id number]  HPI: Kiara Franklin is a 28 y.o. female presenting on 01/26/2024 for comprehensive medical examination. Current medical complaints include:none  She currently lives with: Menopausal Symptoms: no  MOOD Patient states she is now on sertraline .  Doesn't feel like it is working well.  She was increased to 100mg  1 week ago.  Walking a lot throughout the day to help clear her mind.  Nothing happened to trigger her sadness but feels an overwhelming sense of sadness.  Her mind is just on over drive. Denies SI.  Depression Screen done today and results listed below:     01/26/2024    3:29 PM 12/06/2023    2:42 PM 01/10/2023    2:27 PM 10/19/2022   11:13 AM 11/26/2021    3:44 PM  Depression screen PHQ 2/9  Decreased Interest 0 2 0 0 0  Down, Depressed, Hopeless 2 3 0 0 0  PHQ - 2 Score 2 5 0 0 0  Altered sleeping 2 2 0 0 0  Tired, decreased energy 3 2 0 0 0  Change in appetite 3 2 0 0 0  Feeling bad or failure about yourself  1 3 0 0 0  Trouble concentrating 0 1 0 0 0  Moving slowly or fidgety/restless 0 2 0 0 0  Suicidal thoughts 0 1 0 0 0  PHQ-9 Score 11 18 0 0 0  Difficult doing work/chores Not difficult at all  Not difficult at all Not difficult at all Not difficult at all      01/26/2024    3:30 PM 12/06/2023    2:42 PM 01/10/2023    2:27 PM 11/26/2021    3:44 PM  GAD 7 : Generalized Anxiety Score  Nervous, Anxious, on Edge 3 3 0 0  Control/stop worrying 3 3 0 0  Worry too much - different things 3 3 0 0  Trouble relaxing 3 3 0 0  Restless 3 3 0 0  Easily annoyed or irritable 1 2 0 0  Afraid - awful might happen 0 2 0 0  Total GAD 7 Score 16 19 0 0  Anxiety Difficulty Not difficult at all  Not difficult at all Not difficult at all      The  patient does not have a history of falls. I did complete a risk assessment for falls. A plan of care for falls was documented.   Past Medical History:  Past Medical History:  Diagnosis Date   Acne     Surgical History:  Past Surgical History:  Procedure Laterality Date   KNEE SURGERY Left 11/2012    Medications:  Current Outpatient Medications on File Prior to Visit  Medication Sig   hydrOXYzine  (ATARAX ) 25 MG tablet Take 1 tablet (25 mg total) by mouth every 6 (six) hours as needed for anxiety.   sertraline  (ZOLOFT ) 100 MG tablet Take 1 tablet (100 mg total) by mouth daily.   No current facility-administered medications on file prior to visit.    Allergies:  No Known Allergies  Social History:  Social History   Socioeconomic History   Marital status: Married  Spouse name: Not on file   Number of children: Not on file   Years of education: Not on file   Highest education level: Not on file  Occupational History   Not on file  Tobacco Use   Smoking status: Never   Smokeless tobacco: Never  Vaping Use   Vaping status: Never Used  Substance and Sexual Activity   Alcohol use: No   Drug use: No   Sexual activity: Yes    Birth control/protection: None  Other Topics Concern   Not on file  Social History Narrative   Not on file   Social Drivers of Health   Financial Resource Strain: Low Risk  (01/26/2024)   Overall Financial Resource Strain (CARDIA)    Difficulty of Paying Living Expenses: Not hard at all  Food Insecurity: No Food Insecurity (01/26/2024)   Hunger Vital Sign    Worried About Running Out of Food in the Last Year: Never true    Ran Out of Food in the Last Year: Never true  Transportation Needs: No Transportation Needs (01/26/2024)   PRAPARE - Administrator, Civil Service (Medical): No    Lack of Transportation (Non-Medical): No  Physical Activity: Sufficiently Active (01/26/2024)   Exercise Vital Sign    Days of Exercise per Week: 5  days    Minutes of Exercise per Session: 120 min  Stress: Stress Concern Present (01/26/2024)   [employer] of Occupational Health - Occupational Stress Questionnaire    Feeling of Stress: Very much  Social Connections: Socially Integrated (01/26/2024)   Social Connection and Isolation Panel    Frequency of Communication with Friends and Family: More than three times a week    Frequency of Social Gatherings with Friends and Family: More than three times a week    Attends Religious Services: More than 4 times per year    Active Member of [employer] or Organizations: Yes    Attends Banker Meetings: 1 to 4 times per year    Marital Status: Married  Catering manager Violence: Not At Risk (01/26/2024)   Humiliation, Afraid, Rape, and Kick questionnaire    Fear of Current or Ex-Partner: No    Emotionally Abused: No    Physically Abused: No    Sexually Abused: No   Social History   Tobacco Use  Smoking Status Never  Smokeless Tobacco Never   Social History   Substance and Sexual Activity  Alcohol Use No    Family History:  Family History  Problem Relation Age of Onset   Asthma Mother    Healthy Father    Thyroid  disease Maternal Aunt    Cancer Maternal Grandmother        unknown type   COPD Neg Hx    Diabetes Neg Hx    Heart disease Neg Hx    Stroke Neg Hx     Past medical history, surgical history, medications, allergies, family history and social history reviewed with patient today and changes made to appropriate areas of the chart.   Review of Systems  Psychiatric/Behavioral:  Positive for depression. Negative for suicidal ideas. The patient is nervous/anxious.    All other ROS negative except what is listed above and in the HPI.      Objective:    BP (!) 137/90   Pulse 80   Temp 98.6 F (37 C) (Oral)   Ht 5' 4 (1.626 m)   Wt 137 lb 3.2 oz (62.2 kg)   SpO2 99%  BMI 23.55 kg/m   Wt Readings from Last 3 Encounters:  01/26/24 137 lb 3.2 oz  (62.2 kg)  12/06/23 143 lb (64.9 kg)  01/10/23 153 lb 6.4 oz (69.6 kg)    Physical Exam Vitals and nursing note reviewed.  Constitutional:      General: She is awake. She is not in acute distress.    Appearance: Normal appearance. She is well-developed. She is not ill-appearing.  HENT:     Head: Normocephalic and atraumatic.     Right Ear: Hearing, tympanic membrane, ear canal and external ear normal. No drainage.     Left Ear: Hearing, tympanic membrane, ear canal and external ear normal. No drainage.     Nose: Nose normal.     Right Sinus: No maxillary sinus tenderness or frontal sinus tenderness.     Left Sinus: No maxillary sinus tenderness or frontal sinus tenderness.     Mouth/Throat:     Mouth: Mucous membranes are moist.     Pharynx: Oropharynx is clear. Uvula midline. No pharyngeal swelling, oropharyngeal exudate or posterior oropharyngeal erythema.  Eyes:     General: Lids are normal.        Right eye: No discharge.        Left eye: No discharge.     Extraocular Movements: Extraocular movements intact.     Conjunctiva/sclera: Conjunctivae normal.     Pupils: Pupils are equal, round, and reactive to light.     Visual Fields: Right eye visual fields normal and left eye visual fields normal.  Neck:     Thyroid : No thyromegaly.     Vascular: No carotid bruit.     Trachea: Trachea normal.  Cardiovascular:     Rate and Rhythm: Normal rate and regular rhythm.     Heart sounds: Normal heart sounds. No murmur heard.    No gallop.  Pulmonary:     Effort: Pulmonary effort is normal. No accessory muscle usage or respiratory distress.     Breath sounds: Normal breath sounds.  Chest:  Breasts:    Right: Normal.     Left: Normal.  Abdominal:     General: Bowel sounds are normal.     Palpations: Abdomen is soft. There is no hepatomegaly or splenomegaly.     Tenderness: There is no abdominal tenderness.  Musculoskeletal:        General: Normal range of motion.     Cervical  back: Normal range of motion and neck supple.     Right lower leg: No edema.     Left lower leg: No edema.  Lymphadenopathy:     Head:     Right side of head: No submental, submandibular, tonsillar, preauricular or posterior auricular adenopathy.     Left side of head: No submental, submandibular, tonsillar, preauricular or posterior auricular adenopathy.     Cervical: No cervical adenopathy.     Upper Body:     Right upper body: No supraclavicular, axillary or pectoral adenopathy.     Left upper body: No supraclavicular, axillary or pectoral adenopathy.  Skin:    General: Skin is warm and dry.     Capillary Refill: Capillary refill takes less than 2 seconds.     Findings: No rash.  Neurological:     Mental Status: She is alert and oriented to person, place, and time.     Gait: Gait is intact.  Psychiatric:        Attention and Perception: Attention normal.  Mood and Affect: Mood normal.        Speech: Speech normal.        Behavior: Behavior normal. Behavior is cooperative.        Thought Content: Thought content normal.        Judgment: Judgment normal.     Results for orders placed or performed in visit on 01/10/23  Comp Met (CMET)   Collection Time: 01/10/23  3:04 PM  Result Value Ref Range   Glucose 89 70 - 99 mg/dL   BUN 16 6 - 20 mg/dL   Creatinine, Ser 9.12 0.57 - 1.00 mg/dL   eGFR 93 >40 fO/fpw/8.26   BUN/Creatinine Ratio 18 9 - 23   Sodium 137 134 - 144 mmol/L   Potassium 4.5 3.5 - 5.2 mmol/L   Chloride 102 96 - 106 mmol/L   CO2 24 20 - 29 mmol/L   Calcium 9.3 8.7 - 10.2 mg/dL   Total Protein 6.7 6.0 - 8.5 g/dL   Albumin 4.5 4.0 - 5.0 g/dL   Globulin, Total 2.2 1.5 - 4.5 g/dL   Bilirubin Total 0.3 0.0 - 1.2 mg/dL   Alkaline Phosphatase 66 44 - 121 IU/L   AST 18 0 - 40 IU/L   ALT 16 0 - 32 IU/L  CBC w/Diff   Collection Time: 01/10/23  3:04 PM  Result Value Ref Range   WBC 9.4 3.4 - 10.8 x10E3/uL   RBC 4.79 3.77 - 5.28 x10E6/uL   Hemoglobin 14.1  11.1 - 15.9 g/dL   Hematocrit 57.3 65.9 - 46.6 %   MCV 89 79 - 97 fL   MCH 29.4 26.6 - 33.0 pg   MCHC 33.1 31.5 - 35.7 g/dL   RDW 87.3 88.2 - 84.5 %   Platelets 411 150 - 450 x10E3/uL   Neutrophils 58 Not Estab. %   Lymphs 30 Not Estab. %   Monocytes 9 Not Estab. %   Eos 2 Not Estab. %   Basos 1 Not Estab. %   Neutrophils Absolute 5.6 1.4 - 7.0 x10E3/uL   Lymphocytes Absolute 2.8 0.7 - 3.1 x10E3/uL   Monocytes Absolute 0.9 0.1 - 0.9 x10E3/uL   EOS (ABSOLUTE) 0.1 0.0 - 0.4 x10E3/uL   Basophils Absolute 0.1 0.0 - 0.2 x10E3/uL   Immature Granulocytes 0 Not Estab. %   Immature Grans (Abs) 0.0 0.0 - 0.1 x10E3/uL  Lipid Profile   Collection Time: 01/10/23  3:04 PM  Result Value Ref Range   Cholesterol, Total 155 100 - 199 mg/dL   Triglycerides 76 0 - 149 mg/dL   HDL 47 >60 mg/dL   VLDL Cholesterol Cal 15 5 - 40 mg/dL   LDL Chol Calc (NIH) 93 0 - 99 mg/dL   Chol/HDL Ratio 3.3 0.0 - 4.4 ratio  HgB A1c   Collection Time: 01/10/23  3:04 PM  Result Value Ref Range   Hgb A1c MFr Bld 5.5 4.8 - 5.6 %   Est. average glucose Bld gHb Est-mCnc 111 mg/dL  TSH   Collection Time: 01/10/23  3:04 PM  Result Value Ref Range   TSH 1.430 0.450 - 4.500 uIU/mL      Assessment & Plan:   Problem List Items Addressed This Visit       Other   Anxiety, generalized   Chronic. Not well controlled.  Zoloft  was increased about a week ago with OB.  Continue to follow up with specialist. Can follow up if here she prefers.  Other Visit Diagnoses       Annual physical exam    -  Primary   Health maintenance reviewed during visit today.  Labs ordered.  Vaccines reviewed.  PAP up to date.   Relevant Orders   CBC with Differential/Platelet   Comprehensive metabolic panel with GFR   Lipid panel   TSH     Screening for ischemic heart disease       Relevant Orders   Lipid panel        Follow up plan: Return in about 1 year (around 01/25/2025) for Physical and Fasting labs.   LABORATORY  TESTING:  - Pap smear: up to date  IMMUNIZATIONS:   - Tdap: Tetanus vaccination status reviewed: last tetanus booster within 10 years. - Influenza: Postponed to flu season - Pneumovax: Not applicable - Prevnar: Not applicable - COVID: Not applicable - HPV: Not applicable - Shingrix vaccine: Not applicable  SCREENING: -Mammogram: Not applicable  - Colonoscopy: Not applicable  - Bone Density: Not applicable  -Hearing Test: Not applicable  -Spirometry: Not applicable   PATIENT COUNSELING:   Advised to take 1 mg of folate supplement per day if capable of pregnancy.   Sexuality: Discussed sexually transmitted diseases, partner selection, use of condoms, avoidance of unintended pregnancy  and contraceptive alternatives.   Advised to avoid cigarette smoking.  I discussed with the patient that most people either abstain from alcohol or drink within safe limits (<=14/week and <=4 drinks/occasion for males, <=7/weeks and <= 3 drinks/occasion for females) and that the risk for alcohol disorders and other health effects rises proportionally with the number of drinks per week and how often a drinker exceeds daily limits.  Discussed cessation/primary prevention of drug use and availability of treatment for abuse.   Diet: Encouraged to adjust caloric intake to maintain  or achieve ideal body weight, to reduce intake of dietary saturated fat and total fat, to limit sodium intake by avoiding high sodium foods and not adding table salt, and to maintain adequate dietary potassium and calcium preferably from fresh fruits, vegetables, and low-fat dairy products.    stressed the importance of regular exercise  Injury prevention: Discussed safety belts, safety helmets, smoke detector, smoking near bedding or upholstery.   Dental health: Discussed importance of regular tooth brushing, flossing, and dental visits.    NEXT PREVENTATIVE PHYSICAL DUE IN 1 YEAR. Return in about 1 year (around 01/25/2025)  for Physical and Fasting labs.

## 2024-01-27 ENCOUNTER — Ambulatory Visit: Payer: Self-pay | Admitting: Nurse Practitioner

## 2024-01-27 LAB — LIPID PANEL
Chol/HDL Ratio: 2.7 ratio (ref 0.0–4.4)
Cholesterol, Total: 154 mg/dL (ref 100–199)
HDL: 57 mg/dL (ref >39)
LDL Chol Calc (NIH): 86 mg/dL (ref 0–99)
Triglycerides: 54 mg/dL (ref 0–149)
VLDL Cholesterol Cal: 11 mg/dL (ref 5–40)

## 2024-01-27 LAB — COMPREHENSIVE METABOLIC PANEL WITH GFR
ALT: 12 IU/L (ref 0–32)
AST: 16 IU/L (ref 0–40)
Albumin: 4.6 g/dL (ref 4.0–5.0)
Alkaline Phosphatase: 51 IU/L (ref 44–121)
BUN/Creatinine Ratio: 20 (ref 9–23)
BUN: 17 mg/dL (ref 6–20)
Bilirubin Total: 0.9 mg/dL (ref 0.0–1.2)
CO2: 22 mmol/L (ref 20–29)
Calcium: 9.6 mg/dL (ref 8.7–10.2)
Chloride: 98 mmol/L (ref 96–106)
Creatinine, Ser: 0.84 mg/dL (ref 0.57–1.00)
Globulin, Total: 2.7 g/dL (ref 1.5–4.5)
Glucose: 79 mg/dL (ref 70–99)
Potassium: 4.1 mmol/L (ref 3.5–5.2)
Sodium: 135 mmol/L (ref 134–144)
Total Protein: 7.3 g/dL (ref 6.0–8.5)
eGFR: 96 mL/min/1.73 (ref >59)

## 2024-01-27 LAB — CBC WITH DIFFERENTIAL/PLATELET
Basophils Absolute: 0.1 x10E3/uL (ref 0.0–0.2)
Basos: 1 %
EOS (ABSOLUTE): 0.2 x10E3/uL (ref 0.0–0.4)
Eos: 2 %
Hematocrit: 43.9 % (ref 34.0–46.6)
Hemoglobin: 14.2 g/dL (ref 11.1–15.9)
Immature Grans (Abs): 0 x10E3/uL (ref 0.0–0.1)
Immature Granulocytes: 0 %
Lymphocytes Absolute: 2.3 x10E3/uL (ref 0.7–3.1)
Lymphs: 24 %
MCH: 30.5 pg (ref 26.6–33.0)
MCHC: 32.3 g/dL (ref 31.5–35.7)
MCV: 94 fL (ref 79–97)
Monocytes Absolute: 0.8 x10E3/uL (ref 0.1–0.9)
Monocytes: 9 %
Neutrophils Absolute: 6.2 x10E3/uL (ref 1.4–7.0)
Neutrophils: 64 %
Platelets: 383 x10E3/uL (ref 150–450)
RBC: 4.66 x10E6/uL (ref 3.77–5.28)
RDW: 12.7 % (ref 11.7–15.4)
WBC: 9.6 x10E3/uL (ref 3.4–10.8)

## 2024-01-27 LAB — TSH: TSH: 1.19 u[IU]/mL (ref 0.450–4.500)

## 2024-02-06 ENCOUNTER — Encounter: Payer: Self-pay | Admitting: Obstetrics and Gynecology

## 2024-02-07 ENCOUNTER — Ambulatory Visit: Admitting: Obstetrics and Gynecology

## 2024-02-11 ENCOUNTER — Other Ambulatory Visit: Payer: Self-pay | Admitting: Obstetrics and Gynecology

## 2024-02-11 DIAGNOSIS — F32A Depression, unspecified: Secondary | ICD-10-CM

## 2024-03-01 ENCOUNTER — Ambulatory Visit (INDEPENDENT_AMBULATORY_CARE_PROVIDER_SITE_OTHER): Admitting: Obstetrics and Gynecology

## 2024-03-01 ENCOUNTER — Encounter: Payer: Self-pay | Admitting: Obstetrics and Gynecology

## 2024-03-01 VITALS — BP 129/85 | HR 79 | Ht 64.0 in | Wt 133.0 lb

## 2024-03-01 DIAGNOSIS — F32A Depression, unspecified: Secondary | ICD-10-CM

## 2024-03-01 DIAGNOSIS — F419 Anxiety disorder, unspecified: Secondary | ICD-10-CM | POA: Diagnosis not present

## 2024-03-01 MED ORDER — VENLAFAXINE HCL ER 37.5 MG PO CP24: 37.5000 mg | ORAL_CAPSULE | Freq: Every day | ORAL | 0 refills | Status: DC

## 2024-03-01 MED ORDER — VENLAFAXINE HCL ER 75 MG PO CP24: 75.0000 mg | ORAL_CAPSULE | Freq: Every day | ORAL | 1 refills | Status: DC

## 2024-03-01 NOTE — Progress Notes (Signed)
 PCP:  Melvin Pao, NP   Chief Complaint  Patient presents with   Follow-up    Anx/Dep     HPI:      [name] is a 28 y.o. G1P1001 here for anxiety/depression f/u from 12/06/23. Started on zoloft  and hydroxyzine . Increased sertraline  to 100 mg 01/12/24 since sx weren't controlled. Pt still having sx, no change in depression sx except felt out of it. Husband states like she isn't really there. No other side effects except loss of appetite with 10# wt loss in 3 months. Hydroxyzine  helps with sleep but pt is groggy the next day so not taking it frequently. No SI. Has started Vit D supp, continues to exericse. Not seeing therapist. No new life stressors. Pt found out FH depression in her mom, controlled with lexapro. Normal labs with PCP 8/25.    12/06/23 NOTE: feeling sad and having anxiety for the past yr, worse the past few months. No prior hx of anxiety/depression, no known FH. No reason to feel sad or anxious, no increased stress, life is good. Pt not getting better and wants to feel normal again. Exercising regularly. Hasn't seen therapist/counselor. Not taking Vit D. Not sleeping well due to worry, mind not turning off. Has panic attacks in night a few times wkly; tried OTC sleep herbal supp with increased HA. Has decreased appetite. Has SI on questionnaire but more that she is hopeless that she will feel better again; wouldn't commit suicide. Not related to menstrual cycle.   Patient Active Problem List   Diagnosis Date Noted   Encounter for surveillance of injectable contraceptive 07/13/2022   Postprandial diarrhea 04/20/2022   Overweight (BMI 25.0-29.9) 10/15/2021   Elevated low density lipoprotein (LDL) cholesterol level 10/12/2021   Hypertension, postpartum condition or complication 11/12/2019   Anxiety, generalized 11/04/2019    Past Surgical History:  Procedure Laterality Date   KNEE SURGERY Left 11/2012    Family History  Problem Relation Age of Onset    Asthma Mother    Healthy Father    Thyroid  disease Maternal Aunt    Cancer Maternal Grandmother        unknown type   COPD Neg Hx    Diabetes Neg Hx    Heart disease Neg Hx    Stroke Neg Hx     Social History   Socioeconomic History   Marital status: Married    Spouse name: Not on file   Number of children: Not on file   Years of education: Not on file   Highest education level: Not on file  Occupational History   Not on file  Tobacco Use   Smoking status: Never   Smokeless tobacco: Never  Vaping Use   Vaping status: Never Used  Substance and Sexual Activity   Alcohol use: No   Drug use: No   Sexual activity: Yes    Birth control/protection: None  Other Topics Concern   Not on file  Social History Narrative   Not on file   Social Drivers of Health   Financial Resource Strain: Low Risk  (01/26/2024)   Overall Financial Resource Strain (CARDIA)    Difficulty of Paying Living Expenses: Not hard at all  Food Insecurity: No Food Insecurity (01/26/2024)   Hunger Vital Sign    Worried About Running Out of Food in the Last Year: Never true    Ran Out of Food in the Last Year: Never true  Transportation Needs: No Transportation Needs (01/26/2024)   PRAPARE -  Administrator, Civil Service (Medical): No    Lack of Transportation (Non-Medical): No  Physical Activity: Sufficiently Active (01/26/2024)   Exercise Vital Sign    Days of Exercise per Week: 5 days    Minutes of Exercise per Session: 120 min  Stress: Stress Concern Present (01/26/2024)   [employer] of Occupational Health - Occupational Stress Questionnaire    Feeling of Stress: Very much  Social Connections: Socially Integrated (01/26/2024)   Social Connection and Isolation Panel    Frequency of Communication with Friends and Family: More than three times a week    Frequency of Social Gatherings with Friends and Family: More than three times a week    Attends Religious Services: More than 4 times  per year    Active Member of [employer] or Organizations: Yes    Attends Banker Meetings: 1 to 4 times per year    Marital Status: Married  Catering manager Violence: Not At Risk (01/26/2024)   Humiliation, Afraid, Rape, and Kick questionnaire    Fear of Current or Ex-Partner: No    Emotionally Abused: No    Physically Abused: No    Sexually Abused: No     Current Outpatient Medications:    hydrOXYzine  (ATARAX ) 25 MG tablet, Take 1 tablet (25 mg total) by mouth every 6 (six) hours as needed for anxiety., Disp: 30 tablet, Rfl: 1   venlafaxine XR (EFFEXOR XR) 37.5 MG 24 hr capsule, Take 1 capsule (37.5 mg total) by mouth daily with breakfast., Disp: 7 capsule, Rfl: 0   venlafaxine XR (EFFEXOR-XR) 75 MG 24 hr capsule, Take 1 capsule (75 mg total) by mouth daily., Disp: 30 capsule, Rfl: 1     ROS:  Review of Systems  Constitutional:  Negative for fever.  Gastrointestinal:  Negative for blood in stool, constipation, diarrhea, nausea and vomiting.  Genitourinary:  Negative for dyspareunia, dysuria, flank pain, frequency, hematuria, urgency, vaginal bleeding, vaginal discharge and vaginal pain.  Musculoskeletal:  Negative for back pain.  Skin:  Negative for rash.  Psychiatric/Behavioral:  Positive for agitation and dysphoric mood.    BREAST: No symptoms   Objective: BP 129/85   Pulse 79   Ht 5' 4 (1.626 m)   Wt 133 lb (60.3 kg)   LMP 02/28/2024 (Exact Date)   BMI 22.83 kg/m    Physical Exam Constitutional:      Appearance: She is well-developed.  Pulmonary:     Effort: Pulmonary effort is normal.  Musculoskeletal:        General: Normal range of motion.     Cervical back: Normal range of motion.  Neurological:     Mental Status: She is alert and oriented to person, place, and time.  Skin:    General: Skin is warm.  Psychiatric:        Behavior: Behavior normal.        Thought Content: Thought content normal.       03/01/2024    4:23 PM 01/26/2024     3:30 PM 12/06/2023    2:42 PM 01/10/2023    2:27 PM  GAD 7 : Generalized Anxiety Score  Nervous, Anxious, on Edge 3 3 3  0  Control/stop worrying 3 3 3  0  Worry too much - different things 2 3 3  0  Trouble relaxing 2 3 3  0  Restless 2 3 3  0  Easily annoyed or irritable 2 1 2  0  Afraid - awful might happen 0 0 2 0  Total GAD 7 Score 14 16 19  0  Anxiety Difficulty  Not difficult at all  Not difficult at all       03/01/2024    4:23 PM  Depression screen PHQ 2/9  Decreased Interest 2  Down, Depressed, Hopeless 2  PHQ - 2 Score 4  Altered sleeping 2  Tired, decreased energy 3  Change in appetite 3  Feeling bad or failure about yourself  2  Trouble concentrating 2  Moving slowly or fidgety/restless 3  Suicidal thoughts 0  PHQ-9 Score 19   GAD=19, PHQ=18 on 12/06/23.   Assessment/Plan:  Anxiety and depression - Plan: venlafaxine XR (EFFEXOR XR) 37.5 MG 24 hr capsule, venlafaxine XR (EFFEXOR-XR) 75 MG 24 hr capsule; no sx improvement of depression with sertraline  100 mg. Will change to effexor; wean off sertraline  then start effexor. Pt to f/u with PCP for mgmt in 4-6 wks since Im retiring/ sooner with me prn. Can try 1/2 dose hydroxyzine  prn sleep.    Meds ordered this encounter  Medications   venlafaxine XR (EFFEXOR XR) 37.5 MG 24 hr capsule    Sig: Take 1 capsule (37.5 mg total) by mouth daily with breakfast.    Dispense:  7 capsule    Refill:  0    Supervising Provider:   ROBY, MICIA [[id number]]   venlafaxine XR (EFFEXOR-XR) 75 MG 24 hr capsule    Sig: Take 1 capsule (75 mg total) by mouth daily.    Dispense:  30 capsule    Refill:  1    Supervising Provider:   ROBY, MICIA [[id number]]             GYN counsel adequate intake of calcium and vitamin D, diet and exercise     F/U  Return if symptoms worsen or fail to improve.  Emylia Latella B. Isamu Trammel, PA-C 03/01/2024 4:39 PM

## 2024-03-01 NOTE — Patient Instructions (Signed)
 I value your feedback and you entrusting [place] with your care. If you get a [facility] patient survey, I would appreciate you taking the time to let us know about your experience today. Thank you! ? ? ?

## 2024-03-16 ENCOUNTER — Encounter: Payer: Self-pay | Admitting: Nurse Practitioner

## 2024-03-16 ENCOUNTER — Ambulatory Visit: Payer: Self-pay

## 2024-03-16 ENCOUNTER — Telehealth: Admitting: Nurse Practitioner

## 2024-03-16 DIAGNOSIS — H6692 Otitis media, unspecified, left ear: Secondary | ICD-10-CM | POA: Insufficient documentation

## 2024-03-16 DIAGNOSIS — H6502 Acute serous otitis media, left ear: Secondary | ICD-10-CM | POA: Diagnosis not present

## 2024-03-16 MED ORDER — AMOXICILLIN-POT CLAVULANATE 875-125 MG PO TABS: 1.0000 | ORAL_TABLET | Freq: Two times a day (BID) | ORAL | 0 refills | Status: AC

## 2024-03-16 MED ORDER — PREDNISONE 20 MG PO TABS: 20.0000 mg | ORAL_TABLET | Freq: Every day | ORAL | 0 refills | Status: AC

## 2024-03-16 NOTE — Telephone Encounter (Signed)
 FYI Only or Action Required?: FYI only for provider.  Patient was last seen in primary care on 01/26/2024 by Melvin Pao, NP.  Called Nurse Triage reporting Otalgia.  Symptoms began yesterday.  Interventions attempted: Nothing.  Symptoms are: left ear pain with left cheek/jaw pain gradually worsening.  Triage Disposition: See Physician Within 24 Hours  Patient/caregiver understands and will follow disposition?: Yes              Copied from CRM [id number]. Topic: Clinical - Red Word Triage >> Mar 16, 2024  8:09 AM Carlyon D wrote: Red Word that prompted transfer to Nurse Triage: possible ear infection pain from the ear going down to the jaw. Reason for Disposition  Earache  (Exceptions: Brief ear pain of lasting less than 60 minutes, or earache occurring during air travel.)  Answer Assessment - Initial Assessment Questions Ear pain not listed on approved virtual appointments and patient is requesting to be seen virtually as she is out of town in [place], [employer]. Called [facility] and spoke with Iris, ear pain not approved by Dr Herold for virtual visit. Audio disconnected when returning to patient's call, called patient back. CMA [place] responded to patient that NP Jolene will see her virtually this afternoon. Patient requesting RN to schedule her, [place] already scheduled patient for this afternoon. Called [facility] and confirmed okay for patient to be seen virtually. Patient aware of appointment date and time.   1. LOCATION: Which ear is involved?     Left ear.  2. ONSET: When did the ear pain start?      Yesterday, last night. Worsened by the middle of the night.  3. SEVERITY: How bad is the pain?  (Scale 1-10; mild, moderate or severe)     7/10. Pressure on left side of face from ear to cheek and into jaw.  4. URI SYMPTOMS: Do you have a runny nose or cough?     No.  5. FEVER: Do you have a fever? If Yes, ask: What is your temperature, how was it measured, and  when did it start?     Unsure.  6. CAUSE: Have you been swimming recently?, How often do you use Q-TIPS?, Have you had any recent air travel or scuba diving?     Denies swimming. She states she uses Q tips every other day. Denies using Q tip prior to pain starting. Denies air travel or scuba diving.  7. OTHER SYMPTOMS: Do you have any other symptoms? (e.g., decreased hearing, dizziness, headache, stiff neck, vomiting)     Denies body aches, chest pain, SOB, nausea, vomiting, ear drainage, headache. She states when she leans to the right she can hear fluid in her left ear. States muffled hearing in left ear as well.  8. PREGNANCY: Is there any chance you are pregnant? When was your last menstrual period?     LMP: 2 weeks ago.  Protocols used: Rilla

## 2024-03-16 NOTE — Progress Notes (Signed)
 LMP 02/28/2024 (Exact Date)    Subjective:    Patient ID: Kiara Franklin, female    DOB: August 23, 1995, 28 y.o.   MRN: [id number]  HPI: Kiara Franklin is a 28 y.o. female  Chief Complaint  Patient presents with   Ear Pain    Patient states she has been having L ear pain and pressure in the left side of her face that started overnight. States she feels like their is fluid in her ear.    Virtual Visit via Video Note  I connected with Kiara Franklin on 03/16/24 at  4:20 PM EDT by a video enabled telemedicine application and verified that I am speaking with the correct person using two identifiers.  Location: Patient: home Provider: work   I discussed the limitations of evaluation and management by telemedicine and the availability of in person appointments. The patient expressed understanding and agreed to proceed.  I discussed the assessment and treatment plan with the patient. The patient was provided an opportunity to ask questions and all were answered. The patient agreed with the plan and demonstrated an understanding of the instructions.   The patient was advised to call back or seek an in-person evaluation if the symptoms worsen or if the condition fails to improve as anticipated.  I provided 25 minutes of non-face-to-face time during this encounter.   Myron Lona T Kentavious Michele, NP   EAR PAIN Having left ear pain that started late yesterday and has gotten worse.  Down the left side of face into jaw[place]. Last ear infection was in January 2025, treated with Augmentin  which offered benefit. She is not using Flonase  ordered in past.  No drainage from ear. Duration: days Involved ear(s): left Severity:  8/10  Quality:  sharp, dull, aching, and throbbing Fever: no Otorrhea: no Upper respiratory infection symptoms: no Pruritus: no Hearing loss: no Water immersion no Using Q-tips: yes Recurrent otitis media: no Status:  worse Treatments attempted: Ibuprofen    Relevant past medical, surgical, family and social history reviewed and updated as indicated. Interim medical history since our last visit reviewed. Allergies and medications reviewed and updated.  Review of Systems  Constitutional:  Negative for activity change, appetite change, diaphoresis, fatigue and fever.  HENT:  Positive for ear pain. Negative for ear discharge, facial swelling and hearing loss.   Respiratory:  Negative for cough, chest tightness, shortness of breath and wheezing.   Cardiovascular:  Negative for chest pain, palpitations and leg swelling.  Neurological: Negative.   Psychiatric/Behavioral: Negative.      Per HPI unless specifically indicated above     Objective:    LMP 02/28/2024 (Exact Date)   Wt Readings from Last 3 Encounters:  03/01/24 133 lb (60.3 kg)  01/26/24 137 lb 3.2 oz (62.2 kg)  12/06/23 143 lb (64.9 kg)    Physical Exam Vitals and nursing note reviewed.  Constitutional:      General: She is awake. She is not in acute distress.    Appearance: She is well-developed and well-groomed. She is not ill-appearing or toxic-appearing.  HENT:     Head: Normocephalic.     Comments: Unable to assess inner ear due to virtual visit.    Right Ear: Hearing normal.     Left Ear: Hearing normal.  Eyes:     General: Lids are normal.        Right eye: No discharge.        Left eye:  No discharge.     Conjunctiva/sclera: Conjunctivae normal.  Pulmonary:     Effort: Pulmonary effort is normal. No accessory muscle usage or respiratory distress.  Musculoskeletal:     Cervical back: Normal range of motion.  Neurological:     Mental Status: She is alert and oriented to person, place, and time.  Psychiatric:        Attention and Perception: Attention normal.        Mood and Affect: Mood normal.        Behavior: Behavior normal. Behavior is cooperative.        Thought Content: Thought content normal.        Judgment:  Judgment normal.     Results for orders placed or performed in visit on 01/26/24  CBC with Differential/Platelet   Collection Time: 01/26/24  3:44 PM  Result Value Ref Range   WBC 9.6 3.4 - 10.8 x10E3/uL   RBC 4.66 3.77 - 5.28 x10E6/uL   Hemoglobin 14.2 11.1 - 15.9 g/dL   Hematocrit 56.0 65.9 - 46.6 %   MCV 94 79 - 97 fL   MCH 30.5 26.6 - 33.0 pg   MCHC 32.3 31.5 - 35.7 g/dL   RDW 87.2 88.2 - 84.5 %   Platelets 383 150 - 450 x10E3/uL   Neutrophils 64 Not Estab. %   Lymphs 24 Not Estab. %   Monocytes 9 Not Estab. %   Eos 2 Not Estab. %   Basos 1 Not Estab. %   Neutrophils Absolute 6.2 1.4 - 7.0 x10E3/uL   Lymphocytes Absolute 2.3 0.7 - 3.1 x10E3/uL   Monocytes Absolute 0.8 0.1 - 0.9 x10E3/uL   EOS (ABSOLUTE) 0.2 0.0 - 0.4 x10E3/uL   Basophils Absolute 0.1 0.0 - 0.2 x10E3/uL   Immature Granulocytes 0 Not Estab. %   Immature Grans (Abs) 0.0 0.0 - 0.1 x10E3/uL  Comprehensive metabolic panel with GFR   Collection Time: 01/26/24  3:44 PM  Result Value Ref Range   Glucose 79 70 - 99 mg/dL   BUN 17 6 - 20 mg/dL   Creatinine, Ser 9.15 0.57 - 1.00 mg/dL   eGFR 96 >40 fO/fpw/8.26   BUN/Creatinine Ratio 20 9 - 23   Sodium 135 134 - 144 mmol/L   Potassium 4.1 3.5 - 5.2 mmol/L   Chloride 98 96 - 106 mmol/L   CO2 22 20 - 29 mmol/L   Calcium 9.6 8.7 - 10.2 mg/dL   Total Protein 7.3 6.0 - 8.5 g/dL   Albumin 4.6 4.0 - 5.0 g/dL   Globulin, Total 2.7 1.5 - 4.5 g/dL   Bilirubin Total 0.9 0.0 - 1.2 mg/dL   Alkaline Phosphatase 51 44 - 121 IU/L   AST 16 0 - 40 IU/L   ALT 12 0 - 32 IU/L  Lipid panel   Collection Time: 01/26/24  3:44 PM  Result Value Ref Range   Cholesterol, Total 154 100 - 199 mg/dL   Triglycerides 54 0 - 149 mg/dL   HDL 57 >60 mg/dL   VLDL Cholesterol Cal 11 5 - 40 mg/dL   LDL Chol Calc (NIH) 86 0 - 99 mg/dL   Chol/HDL Ratio 2.7 0.0 - 4.4 ratio  TSH   Collection Time: 01/26/24  3:44 PM  Result Value Ref Range   TSH 1.190 0.450 - 4.500 uIU/mL      Assessment &  Plan:   Problem List Items Addressed This Visit       Nervous and Auditory   Left otitis  media - Primary   Acute, suspect intact TM.  No drainage from ear.  Has history of similar in January treated successfully with Augmentin .  Due to virtual visit unable to assess inner ear.  Will start Augmentin  BID for 7 days and send in Prednisone 20 MG daily for 5 days which may offer benefit to inflammation and pain. Educated her on this plan of care. Highly recommend if any worsening symptoms or drainage presents immediately go to nearest UC or ER for assessment.       Relevant Medications   amoxicillin -clavulanate (AUGMENTIN ) 875-125 MG tablet     Follow up plan: Return if symptoms worsen or fail to improve.

## 2024-03-16 NOTE — Patient Instructions (Signed)

## 2024-03-16 NOTE — Assessment & Plan Note (Signed)
 Acute, suspect intact TM.  No drainage from ear.  Has history of similar in January treated successfully with Augmentin .  Due to virtual visit unable to assess inner ear.  Will start Augmentin  BID for 7 days and send in Prednisone 20 MG daily for 5 days which may offer benefit to inflammation and pain. Educated her on this plan of care. Highly recommend if any worsening symptoms or drainage presents immediately go to nearest UC or ER for assessment.

## 2024-03-16 NOTE — Telephone Encounter (Unsigned)
 Copied from CRM [id number]. Topic: Clinical - Medication Question >> Mar 16, 2024  8:05 AM Carlyon D wrote: Reason for CRM: Pt woke up in the middle of the night with what she believes is a bad ear infection. Pt is out of town this weekend and won't be home until Sunday. Pt was seeing if there's anyway she can get an [facility] so a  family member could pick it from her home town pharmacy  before they head [place] to meet pt for vacation. Please reach out to pt.  [facility]erred Pharmacy: [facility] [phone] [address], [facility] - [address] [place] [address] Phone: [phone] Fax: [phone]

## 2024-04-04 ENCOUNTER — Other Ambulatory Visit: Payer: Self-pay | Admitting: Obstetrics and Gynecology

## 2024-04-04 DIAGNOSIS — F32A Depression, unspecified: Secondary | ICD-10-CM

## 2024-04-05 NOTE — Telephone Encounter (Signed)
 Called pt, no answer, LVMTRC.

## 2024-05-29 ENCOUNTER — Ambulatory Visit

## 2024-05-29 VITALS — BP 128/87 | HR 94 | Resp 16 | Ht 64.0 in | Wt 125.3 lb

## 2024-05-29 DIAGNOSIS — Z3201 Encounter for pregnancy test, result positive: Secondary | ICD-10-CM | POA: Diagnosis not present

## 2024-05-29 DIAGNOSIS — O3680X0 Pregnancy with inconclusive fetal viability, not applicable or unspecified: Secondary | ICD-10-CM

## 2024-05-29 DIAGNOSIS — N912 Amenorrhea, unspecified: Secondary | ICD-10-CM

## 2024-05-29 LAB — POCT URINE PREGNANCY: Preg Test, Ur: POSITIVE — AB

## 2024-05-29 NOTE — Progress Notes (Signed)
" ° ° °  NURSE VISIT NOTE  Subjective:    Patient ID: Kiara Franklin, female    DOB: December 29, 1995, 28 y.o.   MRN: [id number]  HPI  Patient is a 28 y.o. G38P1001 female who presents for evaluation of amenorrhea. She believes she could be pregnant. Pregnancy is desired. Sexual Activity: single partner, contraception: none. Current symptoms also include: breast tenderness, frequent urination, and positive home pregnancy test. Last period was normal.    Objective:    There were no vitals taken for this visit.  Lab Review  No results found for any visits on 05/29/24.  Assessment:   1. Amenorrhea     Plan:   Pregnancy Test: Positive  Estimated Date of Delivery: 01/27/25 BP Cuff Measurement taken. Cuff Size Adult Small Encouraged well-balanced diet, plenty of rest when needed, pre-natal vitamins daily and walking for exercise.  Discussed self-help for nausea, avoiding OTC medications until consulting provider or pharmacist, other than Tylenol  as needed, minimal caffeine (1-2 cups daily) and avoiding alcohol.   She will schedule her nurse visit @ 7-[redacted] wks pregnant, u/s for dating @10  wk, and NOB visit at [redacted] wk pregnant.    Feel free to call with any questions.     Camelia Fetters, CMA [facility]

## 2024-05-29 NOTE — Patient Instructions (Signed)
 First Trimester of Pregnancy  The first trimester of pregnancy starts on the first day of your last monthly period until the end of week 13. This is months 1 through 3 of pregnancy. A week after a sperm fertilizes an egg, the egg will implant into the wall of the uterus and begin to develop into a baby. Body changes during your first trimester Your body goes through many changes during pregnancy. The changes usually return to normal after your baby is born. Physical changes Your breasts may grow larger and may hurt. The area around your nipples may get darker. Your periods will stop. Your hair and nails may grow faster. You may pee more often. Health changes You may tire easily. Your gums may bleed and may be sensitive when you brush and floss. You may not feel hungry. You may have heartburn. You may throw up or feel like you may throw up. You may want to eat some foods, but not others. You may have headaches. You may have trouble pooping (constipation). Other changes Your emotions may change from day to day. You may have more dreams. Follow these instructions at home: Medicines Talk to your health care provider if you're taking medicines. Ask if the medicines are safe to take during pregnancy. Your provider may change the medicines that you take. Do not take any medicines unless told to by your provider. Take a prenatal vitamin that has at least 600 micrograms (mcg) of folic acid. Do not use herbal medicines, illegal substances, or medicines that are not approved by your provider. Eating and drinking While you're pregnant your body needs extra food for your growing baby. Talk with your provider about what to eat while pregnant. Activity Most women are able to exercise during pregnancy. Exercises may need to change as your pregnancy goes on. Talk to your provider about your activities and exercise routines. Relieving pain and discomfort Wear a good, supportive bra if your breasts  hurt. Rest with your legs raised if you have leg cramps or low back pain. Safety Wear your seatbelt at all times when you're in a car. Talk to your provider if someone hits you, hurts you, or yells at you. Talk with your provider if you're feeling sad or have thoughts of hurting yourself. Lifestyle Certain things can be harmful while you're pregnant. Follow these rules: Do not use hot tubs, steam rooms, or saunas. Do not douche. Do not use tampons or scented pads. Do not drink alcohol,smoke, vape, or use products with nicotine or tobacco in them. If you need help quitting, talk with your provider. Avoid cat litter boxes and soil used by cats. These things carry germs that can cause harm to your pregnancy and your baby. General instructions Keep all follow-up visits. It helps you and your unborn baby stay as healthy as possible. Write down your questions. Take them to your visits. Your provider will: Talk with you about your overall health. Give you advice or refer you to specialists who can help with different needs, including: Prenatal education classes. Mental health and counseling. Foods and healthy eating. Ask for help if you need help with food. Call your dentist and ask to be seen. Brush your teeth with a soft toothbrush. Floss gently. Where to find more information American Pregnancy Association: americanpregnancy.org [employer]: acog.org Office on [employer] Health: [place] Contact a health care provider if: You feel dizzy, faint, or have a fever. You vomit or have watery poop (diarrhea) for 2  days or more. You have abnormal discharge or bleeding from your vagina. You have pain when you pee or your pee smells bad. You have cramps, pain, or pressure in your belly area. Get help right away if: You have trouble breathing or chest pain. You have any kind of injury, such as from a fall or a car crash. These symptoms may be an  emergency. Get help right away. Call 911. Do not wait to see if the symptoms will go away. Do not drive yourself to the hospital. This information is not intended to replace advice given to you by your health care provider. Make sure you discuss any questions you have with your health care provider. Morning Sickness Morning sickness is when you throw up or feel like you may throw up during pregnancy. This condition often occurs in the morning, but it can also occur at any time of day. Morning sickness is most common during the first three months of pregnancy, but it can go on throughout the pregnancy. Morning sickness is usually harmless. But if you throw up all the time, you should see your health care provider. You may also hear this condition called nausea and vomiting of pregnancy. What are the causes? The cause of morning sickness is not known. It may be linked to changes in hormones during pregnancy. What increases the risk? You're more likely to have morning sickness if: You had morning sickness in another pregnancy. You're pregnant with more than one baby, such as twins. You had morning sickness in other pregnancies. You have had motion sickness before you were pregnant. You have had bad headaches or migraines before you were pregnant. What are the signs or symptoms? Symptoms of morning sickness include: Feeling like you may throw up. Throwing up. How is this diagnosed? Morning sickness is diagnosed based on your symptoms. How is this treated? Treatment is usually not needed for morning sickness. You may only need to change what you eat. In some cases, your provider may give you: Vitamin B6 supplements. Medicines to prevent throwing up. Ginger. Follow these instructions at home: Medicines Take your medicines only as told by your provider. Do not use any prescription, over-the-counter, or herbal medicines for morning sickness without first talking with your provider. Take  prenatal vitamins. These can stop or lessen the symptoms of morning sickness. If you feel like you may throw up after taking prenatal vitamins, take them at night or with a snack. Eating and drinking     Eat dry toast or crackers before getting out of bed. Eat 5 or 6 small meals a day. Try ginger ale made with real ginger, ginger tea, or ginger candies. Drink fluids throughout the day. Eat protein foods when you need a snack. Nuts, yogurt, and cheese are good choices. Eat dry and bland foods like rice or baked potatoes. Foods that are high in carbohydrates are often helpful. Have someone cook for you if the smell of food makes you want to throw up. Foods to avoid Greasy foods. Fatty foods. Spicy foods. General instructions Try to avoid smells that make you feel sick. Use an air purifier to keep the air in your house free of smells. Try using an acupressure wristband. This is a wristband that's used to treat motion sickness. Try acupuncture. In this treatment, a provider puts thin needles into certain areas of your body to make you feel better. Brush your teeth after throwing up or rinse with a mix of baking soda and water. The  acid in throw-up can hurt your teeth. Contact a health care provider if: Your symptoms do not get better. You feel dizzy or light-headed. You're losing weight. Get help right away if: The feeling that you may throw up will not go away, or you can't stop throwing up. You faint. You have very bad pain in your belly. This information is not intended to replace advice given to you by your health care provider. Make sure you discuss any questions you have with your health care provider. Document Revised: 02/17/2023 Document Reviewed: 08/26/2022 [employer] Patient Education  2024 [employer]  Common Medications Safe in Pregnancy  Acne:      Constipation:  Benzoyl Peroxide     Colace  Clindamycin       Dulcolax Suppository  Topica  Erythromycin     Fibercon  Salicylic Acid      Metamucil         Miralax AVOID:        Senakot   Accutane    Cough:  Retin-A       Cough Drops  Tetracycline      Phenergan w/ Codeine if Rx  Minocycline      Robitussin (Plain & DM)  Antibiotics:     Crabs/Lice:  Ceclor       RID  Cephalosporins    AVOID:  E-Mycins      Kwell  Keflex   Macrobid/Macrodantin   Diarrhea:  Penicillin      Kao-Pectate  Zithromax      Imodium AD         PUSH FLUIDS AVOID:       Cipro     Fever:  Tetracycline      Tylenol  (Regular or Extra  Minocycline       Strength)  Levaquin      Extra Strength-Do not          Exceed 8 tabs/24 hrs Caffeine:        200mg /day (equiv. To 1 cup of coffee or  approx. 3 12 oz sodas)         Gas: Cold/Hayfever:       Gas-X  Benadryl       Mylicon  Claritin       Phazyme  **Claritin-D        Chlor-Trimeton    Headaches:  Dimetapp      ASA-Free Excedrin  Drixoral-Non-Drowsy     Cold Compress  Mucinex (Guaifenasin)     Tylenol  (Regular or Extra  Sudafed/Sudafed-12 Hour     Strength)  **Sudafed PE Pseudoephedrine   Tylenol  Cold & Sinus     Vicks Vapor Rub  Zyrtec  **AVOID if Problems With Blood Pressure         Heartburn: Avoid lying down for at least 1 hour after meals  Aciphex      Maalox     Rash:  Milk of Magnesia     Benadryl     Mylanta       1% Hydrocortisone Cream  Pepcid  Pepcid Complete   Sleep Aids:  Prevacid      Ambien    Prilosec       Benadryl   Rolaids       Chamomile Tea  Tums (Limit 4/day)     Unisom         Tylenol  PM         Warm milk-add vanilla or  Hemorrhoids:       Sugar for taste  Anusol/Anusol H.C.  (RX: Analapram 2.5%)  Sugar Substitutes:  Hydrocortisone OTC     Ok in moderation  Preparation H      Tucks        Vaseline lotion applied to tissue with wiping    Herpes:     Throat:  Acyclovir      Oragel  Famvir  Valtrex     Vaccines:         Flu Shot Leg Cramps:       *Gardasil  Benadryl       Hepatitis  A         Hepatitis B Nasal Spray:       Pneumovax  Saline Nasal Spray     Polio Booster         Tetanus Nausea:       Tuberculosis test or PPD  Vitamin B6 25 mg TID   AVOID:    Dramamine      *Gardasil  Emetrol       Live Poliovirus  Ginger Root 250 mg QID    MMR (measles, mumps &  High Complex Carbs @ Bedtime    rebella)  Sea Bands-Accupressure    Varicella (Chickenpox)  Unisom 1/2 tab TID     *No known complications           If received before Pain:         Known pregnancy;   Darvocet       Resume series after  Lortab        Delivery  Percocet    Yeast:   Tramadol      Femstat  Tylenol  3      Gyne-lotrimin  Ultram       Monistat  Vicodin           MISC:         All Sunscreens           Hair Coloring/highlights          Insect Repellant's          (Including DEET)         Mystic Tans

## 2024-06-18 ENCOUNTER — Telehealth

## 2024-06-18 DIAGNOSIS — Z3689 Encounter for other specified antenatal screening: Secondary | ICD-10-CM

## 2024-06-18 DIAGNOSIS — Z8759 Personal history of other complications of pregnancy, childbirth and the puerperium: Secondary | ICD-10-CM | POA: Insufficient documentation

## 2024-06-18 DIAGNOSIS — Z3480 Encounter for supervision of other normal pregnancy, unspecified trimester: Secondary | ICD-10-CM | POA: Insufficient documentation

## 2024-06-18 NOTE — Patient Instructions (Signed)
 First Trimester of Pregnancy  The first trimester of pregnancy starts on the first day of your last monthly period until the end of week 13. This is months 1 through 3 of pregnancy. A week after a sperm fertilizes an egg, the egg will implant into the wall of the uterus and begin to develop into a baby. Body changes during your first trimester Your body goes through many changes during pregnancy. The changes usually return to normal after your baby is born. Physical changes Your breasts may grow larger and may hurt. The area around your nipples may get darker. Your periods will stop. Your hair and nails may grow faster. You may pee more often. Health changes You may tire easily. Your gums may bleed and may be sensitive when you brush and floss. You may not feel hungry. You may have heartburn. You may throw up or feel like you may throw up. You may want to eat some foods, but not others. You may have headaches. You may have trouble pooping (constipation). Other changes Your emotions may change from day to day. You may have more dreams. Follow these instructions at home: Medicines Talk to your health care provider if you're taking medicines. Ask if the medicines are safe to take during pregnancy. Your provider may change the medicines that you take. Do not take any medicines unless told to by your provider. Take a prenatal vitamin that has at least 600 micrograms (mcg) of folic acid. Do not use herbal medicines, illegal substances, or medicines that are not approved by your provider. Eating and drinking While you're pregnant your body needs extra food for your growing baby. Talk with your provider about what to eat while pregnant. Activity Most women are able to exercise during pregnancy. Exercises may need to change as your pregnancy goes on. Talk to your provider about your activities and exercise routines. Relieving pain and discomfort Wear a good, supportive bra if your breasts  hurt. Rest with your legs raised if you have leg cramps or low back pain. Safety Wear your seatbelt at all times when you're in a car. Talk to your provider if someone hits you, hurts you, or yells at you. Talk with your provider if you're feeling sad or have thoughts of hurting yourself. Lifestyle Certain things can be harmful while you're pregnant. Follow these rules: Do not use hot tubs, steam rooms, or saunas. Do not douche. Do not use tampons or scented pads. Do not drink alcohol,smoke, vape, or use products with nicotine or tobacco in them. If you need help quitting, talk with your provider. Avoid cat litter boxes and soil used by cats. These things carry germs that can cause harm to your pregnancy and your baby. General instructions Keep all follow-up visits. It helps you and your unborn baby stay as healthy as possible. Write down your questions. Take them to your visits. Your provider will: Talk with you about your overall health. Give you advice or refer you to specialists who can help with different needs, including: Prenatal education classes. Mental health and counseling. Foods and healthy eating. Ask for help if you need help with food. Call your dentist and ask to be seen. Brush your teeth with a soft toothbrush. Floss gently. Where to find more information American Pregnancy Association: americanpregnancy.org [employer]: acog.org Office on [employer] Health: [place] Contact a health care provider if: You feel dizzy, faint, or have a fever. You vomit or have watery poop (diarrhea) for 2  days or more. You have abnormal discharge or bleeding from your vagina. You have pain when you pee or your pee smells bad. You have cramps, pain, or pressure in your belly area. Get help right away if: You have trouble breathing or chest pain. You have any kind of injury, such as from a fall or a car crash. These symptoms may be an  emergency. Get help right away. Call 911. Do not wait to see if the symptoms will go away. Do not drive yourself to the hospital. This information is not intended to replace advice given to you by your health care provider. Make sure you discuss any questions you have with your health care provider. Document Revised: 02/17/2023 Document Reviewed: 09/17/2022 Elsevier Patient Education  2024 [employer] Genetic Testing During Pregnancy: What to Know Genetic testing is done when you're pregnant to check if your baby might have a congenital condition. A congenital condition is something a baby is born with, also called a birth condition. These conditions can happen when genes or chromosomes are not normal. Genes are tiny parts in your body that make up chromosomes. Chromosomes are groups of many genes. Together, they tell your body how to look and work. Genes are passed down from parents to their baby. Why is genetic testing done during pregnancy? Genetic testing allows you to: Talk about your test results and future plans with your health care team. Plan for a baby that may be born with a congenital condition. Make plans with your health care team in case your baby needs special care before or after birth. Think about your options regarding whether you want to continue with the pregnancy. Types of genetic tests A genetic test can be a screening test or a diagnostic test. Screening tests     Screening tests are used to check the risk of your baby having a congenital condition. They don't show if your baby actually has the condition. More testing will be needed to know for sure. Screening tests are recommended for all pregnant people. Screening tests will not hurt your baby. Types of screening tests include: Carrier screening. The parents' blood or saliva is tested to check for genes that aren't normal. These genes can be passed to the baby. If both parents have the gene, the baby is at  risk. First-trimester screening. This includes a maternal blood test and an ultrasound of your baby. This test checks for a risk of conditions related to chromosomes. It also looks for problems with your baby's heart, belly, or bones. Second-trimester screening. This may include a maternal blood test and an ultrasound of your baby. This test checks for the risk of conditions related to chromosomes. It also looks for problems with many parts of your baby's body. These include the brain, nose, mouth, spine, heart, and arms or legs. Some people may only have an ultrasound and not have a blood test. Combined or sequential screening. This looks at the results from the blood tests in the first and second trimesters, along with the findings of the first-trimester ultrasound. It helps to tell you more about your baby. This type of testing may be more accurate than just doing screening in the first or second trimester by itself. Cell-free DNA testing. During pregnancy, cells from your placenta get into your blood, which is normal. This test is a blood test that looks at those cells. It's done after 10 weeks of pregnancy. It can be used to check for the risk of conditions  caused by having too many chromosomes or an abnormal number of sex chromosomes.  Diagnostic tests Diagnostic tests are done only if your baby is known to be at risk of having a congenital condition. These tests check the cells from your baby to diagnose a condition. Examples of these tests include: Chorionic villus sampling (CVS). This is a procedure where cells are taken from the placenta for testing. To do this, a needle is put into your belly using guided ultrasound. Amniocentesis. This is a procedure where amniotic fluid is removed from the sac around your baby. The cells from the placenta or amniotic fluid are tested for chromosomes that are not normal. What do the results mean? For a screening test: If your results are negative, it means  that your baby is most likely not at a higher risk for a condition. There's still a small chance your baby could have a condition. If your results are positive, it means that your baby's risk for a condition is higher than normal. Your health care provider may want you to have a diagnostic test. For a diagnostic test: If the result is negative, it's not likely that your baby will have a condition. If the test is positive, your baby most likely has a condition. Talk with your provider about what your results mean and what your options are. Questions to ask your health care provider Talk with your provider about the conditions that run in your family. Ask these questions: Is my baby at risk for a congenital condition? What are the benefits of having genetic screening? Should I meet with a genetic counselor? Should my partner or other members of my family be tested? What tests are best for me and my baby? How much do the tests cost? Will my insurance cover the testing? What are the risks of each test? This information is not intended to replace advice given to you by your health care provider. Make sure you discuss any questions you have with your health care provider. Document Revised: 04/07/2023 Document Reviewed: 04/07/2023 Elsevier Patient Education  2025 [employer]. Tests and Screening During Pregnancy Tests and screenings during pregnancy are an important part of your prenatal care. These tests help your health care provider find any problems that might affect your pregnancy. Some tests need to be done for all pregnant people, and some are optional. Most of the tests and screenings do not pose any risks for you or your baby. You may need more testing if a test result shows there is a risk to your health or your baby's health. Tests and screenings done early in pregnancy Some tests and screenings you may have in early pregnancy are: Blood tests, such as: Complete blood count  (CBC). Blood typing. Tests to check for diseases that can cause birth defects or can be passed to your baby, such as: German measles (rubella( and chicken pox. Hepatitis B and C. Human Immunodeficiency Virus (HIV). Syphilis. Zika virus. Pee tests. Blood pressure. Testing for sexually transmitted infections (STIs), such as chlamydia or gonorrhea. Testing for tuberculosis. Ultrasound. Tests and screenings done later in pregnancy Some common tests you can expect to have later in pregnancy include: Rh antibody testing. Pee and blood tests. Glucose screening. This checks your blood sugar. It will show whether you are developing the type of diabetes that happens during pregnancy, called gestational diabetes. You may have this screening earlier if you have risk factors for diabetes. Ultrasound. This may be repeated at 16-20 weeks to check  how your baby is growing. Screening for group B streptococcus (GBS). GBS is a type of bacteria that may live in your rectum or vagina. GBS can spread to your baby during birth. This test is done at 35-37 weeks of pregnancy. Non-stress test. This may be done more often if your pregnancy is high risk. Biophysical profile. This test includes ultrasound imaging and a non-stress test to check to see if your baby is healthy. This test may help decide when your baby should be born. Screening for birth defects Early in your pregnancy, tests can be done to find out if your baby is at risk for a genetic disorder. This testing is optional. The type of testing recommended for you will depend on your family and medical history, your ethnicity, and your age. Testing may include: Screening tests such as ultrasound, blood tests, or a combination of both. Carrier screening. If genetic screening shows that your baby is at risk for a genetic defect, diagnostic testing may be recommended, such as: Amniocentesis. Chorionic villus sampling. Unlike other tests done during pregnancy,  diagnostic testing does have some risk for your pregnancy. Talk to your provider about the risks and benefits of genetic testing. Questions to ask your health care provider What tests are recommended for me? When and how will these tests be done? When will I get the results of the tests? What do the results of these tests mean for me or my baby? Do you recommend any genetic screening tests? Which ones? Should I see a genetic counselor before having genetic screening? Where to find more information Go to americanpregnancy.org Click on search. Type 'prenatal tests in the search box. Go to [place] Click on search. Type 'prenatal tests in the search box. Go to acog.org Click on search. Type routine tests in the search box. This information is not intended to replace advice given to you by your health care provider. Make sure you discuss any questions you have with your health care provider. Document Revised: 03/15/2023 Document Reviewed: 03/15/2023 Elsevier Patient Education  2025 [employer]. Questions to Ask Your Health Care Provider During Pregnancy  During pregnancy, you'll go through many changes. These will affect your body as well as your feelings and emotions. And you'll likely have a lot of questions. Your health care team is a good source for reliable answers. Make an appointment with your team if you're planning to get pregnant or as soon as you know that you're pregnant. New questions will come up as your pregnancy continues. Write your questions down and take them with you to your prenatal visits. Questions to ask about pregnancy Prenatal visits and tests How often will I have my prenatal visits? Should I visit the dentist during pregnancy? What type of screening tests should I consider? What type of routine tests are suggested and when are they done? What are the risks and benefits of these tests? When would you recommend an ultrasound, and what will it  show? Is there a nurse line or office line I can call if I have questions? Caring for yourself during pregnancy How much weight should I gain? What kind of exercise should I get and how much? How much sleep should I get? What kinds of things can I do to help with stress and anxiety? Are there any travel restrictions? Is it OK to have sex? Eating and drinking during pregnancy What vitamins or supplements do you recommend? When should I start taking my prenatal vitamins? What's a healthy diet  for me during pregnancy? What foods should I eat? What foods should I not eat? What if I'm on a special diet? Should I limit how much caffeine I have? Vaccinations What shots should I get? When is the best time to get them? Are there shots I should not get? Medicine and substance use during pregnancy Are my current medicines OK to keep taking? Which medicines could hurt me or my baby? Which supplements or herbal medicines could hurt me or my baby? Why is it important not to smoke or drink alcohol? What about other drugs or substances? Where can I get help if I'm having a hard time quitting? Questions to ask about labor and birth Getting ready What are my birth options? Should I make a birth plan? Where can I have my baby? Is a birth center or home birth an option for me? What are the benefits of breastfeeding? When should I start preparing for breastfeeding? Classes Are there breastfeeding classes or support groups? Where can I find childbirth classes? Pain relief What is natural childbirth? What can I do to decrease pain during labor and birth? What are other methods to help with pain during labor and birth? Birth What is induced labor? What's an episiotomy, and when might I need one? How can I increase my chance for a vaginal birth? When would a C-section be advised? Can I have a vaginal birth if I had a C-section in the past? Other questions to ask How long will I need to stay in  the hospital after giving birth? How soon can I get pregnant after giving birth? What are my birth control options? What are the signs of perinatal depression? What should I do if I have depression or anxiety that's getting worse? This information is not intended to replace advice given to you by your health care provider. Make sure you discuss any questions you have with your health care provider. Document Revised: 02/08/2023 Document Reviewed: 02/08/2023 Elsevier Patient Education  2024 [employer]. How a Baby Grows During Pregnancy Pregnancy starts when a fertilized egg attaches to the lining of the uterus and begins to grow. It is a time of many changes in the mother's body. The changes happen: To support your pregnancy. To help the baby grow. To prepare for the birth of your baby. How long does a pregnancy last? A pregnancy usually lasts 280 days, or about 40 weeks. Pregnancy is divided into three periods of growth, also called trimesters: First trimester: weeks 0-13. Second trimester: weeks 14-27. Third trimester: weeks 28-40. The end of the 40 weeks of pregnancy is your likely date of delivery, or due date. However, most babies are not born on their due date. How does my baby develop month by month?  First and second month The brain, spinal cord, and heart begin to develop. By 6 weeks, the heart begins to beat. The face, arms, and legs begin to form. Then the hands and feet begin to develop. All major organs begin to develop by the end of the second month. Third month All of the internal organs are forming. Bones and muscles are beginning to grow. The fetus is making movements similar to breathing. Fingernails and toenails are forming. Fourth month The skin is thin and transparent. The neck, outer ear, eyelids, and fingernails are formed. The external sex organs are formed. The fetus can hear, swallow, and move easily. The kidneys begin to make pee (urine). Fifth  month The fetus moves around more and can  be felt for the first time (quickening). The face, nose, and lips can be seen easily on ultrasound. The baby is covered with soft hair called lanugo. The organs in the digestive system work. Sixth month The lungs continue to grow and mature. The eyes open. The brain continues to develop. The fetus may begin to suck a finger. Skin ridges are formed that will be fingerprints and toe prints. Hair grows thicker and eyebrows can be seen. Seventh month Lungs are fully developed but not yet ready for birth. Eyes are developed enough to sense changes in light. The fetus responds to sound. The fetus kicks and stretches. Hands can make a grasping motion. Vernix, a waxy coating, is starting to develop to protect the skin. Eighth month Most organs and body systems are fully developed and working. Bones harden, and taste buds develop. The fetus may hiccup. The brain is still developing. The skull remains soft. By week 31, most development is complete and the fetus is gaining weight fast. By the end of week 32, the fetus weighs a little more than 4 pounds (1.8 kg). Ninth month until your due date The lungs are fully developed and ready for birth. Patterns of sleep develop. The fetus weighs around 6 pounds at the beginning of the ninth month and may weigh around 8 pounds by your due date. The fetus's head typically moves into a head-down position in the uterus. Closer to your due date, the fetus's head may drop lower in your hips. How do I know if my baby is developing well? Always talk with your health care provider about any concerns that you may have about your pregnancy and your baby. At prenatal visits, your provider will do tests to check on your health and keep track of your baby's growth. These include: Fundal height and position. To do this, your provider will: Measure your growing belly from your pubic bone to the top of the uterus using a tape  measure. Feel your belly to determine your baby's position. Heartbeat. An ultrasound in the first trimester can confirm pregnancy and show a heartbeat, depending on how far along you are. Your provider will check your baby's heart rate at prenatal visits. You may also have a second trimester ultrasound to check your baby's development. Follow these instructions at home: Take your medicines as told. Take prenatal vitamins as told by your provider. These include vitamins such as folic acid, iron, calcium, and vitamin D. They are important for healthy development of your baby. Keep all follow-up visits. These include prenatal care and screening tests to check your health and your baby's health. This information is not intended to replace advice given to you by your health care provider. Make sure you discuss any questions you have with your health care provider. Document Revised: 10/18/2022 Document Reviewed: 10/18/2022 Elsevier Patient Education  2024 [employer].

## 2024-06-18 NOTE — Progress Notes (Signed)
 New OB Intake  I connected with  Kiara Franklin on 06/18/24 at  9:15 AM EST by MyChart Video Visit and verified that I am speaking with the correct person using two identifiers. Nurse is located at [facility] and pt is located at home.  I discussed the limitations, risks, security and privacy concerns of performing an evaluation and management service by telephone and the availability of in person appointments. I also discussed with the patient that there may be a patient responsible charge related to this service. The patient expressed understanding and agreed to proceed.  I explained I am completing New OB Intake today. We discussed her EDD of 01/27/25 that is based on LMP of 04/22/24. Pt is G2/P1001. I reviewed her allergies, medications, Medical/Surgical/OB history, and appropriate screenings. There are no cats in the home.. Based on history, this is a/an pregnancy uncomplicated . Her obstetrical history is significant for history of gestational hypertension.  Patient Active Problem List   Diagnosis Date Noted   Supervision of other normal pregnancy, antepartum 06/18/2024   History of gestational hypertension 06/18/2024    Concerns addressed today: None  Delivery Plans:  Plans to deliver at [facility].  Anatomy US  Explained first scheduled US  will be 07/02/24. Anatomy US  will be scheduled around [redacted] weeks gestational age.  Labs Discussed genetic screening with patient. Patient desires genetic testing to be drawn at new OB visit. Discussed possible labs to be drawn at new OB appointment.  COVID Vaccine Patient has had COVID vaccine.   Social Determinants of Health Food Insecurity: denies food insecurity WIC Referral: Patient is not interested in referral to [facility].  Transportation: Patient denies transportation needs. Childcare: Discussed no children allowed at ultrasound appointments.   First visit review I reviewed new OB appt with pt. I explained she will have  blood work and pap smear/pelvic exam if indicated. Explained pt will be seen by Eleanor Canny, CNM at first visit; encounter routed to appropriate provider.   [place], [place] [id number]/80/[id number]  0:61 AM

## 2024-07-02 ENCOUNTER — Other Ambulatory Visit

## 2024-07-11 ENCOUNTER — Other Ambulatory Visit

## 2024-07-16 ENCOUNTER — Encounter: Admitting: Obstetrics

## 2025-01-28 ENCOUNTER — Encounter: Admitting: Nurse Practitioner
